# Patient Record
Sex: Male | Born: 1955 | Race: White | Hispanic: No | Marital: Married | Smoking: Former smoker
Health system: Southern US, Community
[De-identification: ages and names within clinical notes are randomized; demographics above are authoritative.]

## PROBLEM LIST (undated history)

## (undated) DIAGNOSIS — M1990 Unspecified osteoarthritis, unspecified site: Secondary | ICD-10-CM

## (undated) DIAGNOSIS — F988 Other specified behavioral and emotional disorders with onset usually occurring in childhood and adolescence: Secondary | ICD-10-CM

## (undated) DIAGNOSIS — E785 Hyperlipidemia, unspecified: Secondary | ICD-10-CM

## (undated) HISTORY — PX: OTHER SURGICAL HISTORY: SHX169

## (undated) HISTORY — DX: Hyperlipidemia, unspecified: E78.5

## (undated) HISTORY — DX: Unspecified osteoarthritis, unspecified site: M19.90

---

## 1999-06-05 ENCOUNTER — Encounter: Admission: RE | Admit: 1999-06-05 | Discharge: 1999-06-05 | Payer: Self-pay | Admitting: Specialist

## 1999-06-05 ENCOUNTER — Encounter: Payer: Self-pay | Admitting: Specialist

## 2002-05-06 HISTORY — PX: KNEE SURGERY: SHX244

## 2015-11-25 ENCOUNTER — Ambulatory Visit (HOSPITAL_COMMUNITY)
Admission: RE | Admit: 2015-11-25 | Discharge: 2015-11-25 | Disposition: A | Discharge status: Home / Self Care | Payer: Managed Care, Other (non HMO) | Source: Ambulatory Visit | Attending: Specialist | Admitting: Specialist

## 2015-11-25 ENCOUNTER — Other Ambulatory Visit (HOSPITAL_COMMUNITY): Payer: Self-pay | Admitting: Specialist

## 2015-11-25 DIAGNOSIS — R590 Localized enlarged lymph nodes: Secondary | ICD-10-CM | POA: Diagnosis not present

## 2015-11-25 DIAGNOSIS — R609 Edema, unspecified: Secondary | ICD-10-CM

## 2015-11-25 DIAGNOSIS — M7989 Other specified soft tissue disorders: Secondary | ICD-10-CM | POA: Diagnosis not present

## 2015-11-25 NOTE — Progress Notes (Signed)
VASCULAR LAB PRELIMINARY  PRELIMINARY  PRELIMINARY  PRELIMINARY  Right lower extremity venous duplex completed.    Preliminary report:  There is no DVT or SVT noted in the right lower extremity.  Enlarged inguinal lymph nodes noted bilaterally.  Tanaka Gillen, RVT 11/25/2015, 10:36 AM

## 2015-12-12 ENCOUNTER — Encounter: Payer: Self-pay | Admitting: Internal Medicine

## 2015-12-12 ENCOUNTER — Ambulatory Visit (INDEPENDENT_AMBULATORY_CARE_PROVIDER_SITE_OTHER): Payer: Managed Care, Other (non HMO) | Admitting: Internal Medicine

## 2015-12-12 ENCOUNTER — Encounter (INDEPENDENT_AMBULATORY_CARE_PROVIDER_SITE_OTHER): Payer: Self-pay

## 2015-12-12 VITALS — BP 128/80 | HR 66 | Temp 98.0°F | Resp 16 | Ht 73.0 in | Wt 212.0 lb

## 2015-12-12 DIAGNOSIS — E785 Hyperlipidemia, unspecified: Secondary | ICD-10-CM

## 2015-12-12 DIAGNOSIS — Z0000 Encounter for general adult medical examination without abnormal findings: Secondary | ICD-10-CM

## 2015-12-12 DIAGNOSIS — I10 Essential (primary) hypertension: Secondary | ICD-10-CM

## 2015-12-12 DIAGNOSIS — F988 Other specified behavioral and emotional disorders with onset usually occurring in childhood and adolescence: Secondary | ICD-10-CM | POA: Insufficient documentation

## 2015-12-12 DIAGNOSIS — M1711 Unilateral primary osteoarthritis, right knee: Secondary | ICD-10-CM

## 2015-12-12 DIAGNOSIS — Z136 Encounter for screening for cardiovascular disorders: Secondary | ICD-10-CM | POA: Diagnosis not present

## 2015-12-12 NOTE — Progress Notes (Signed)
NEW Patient Establishment and Complete Physical Examination   This very nice 60 y.o.male presents for complete physical.  Patient has no major health issues.  Patient reports no complaints at this time.   He reports that he is married for 35 years and does enjoy being outside.  He has grown children.  He reports that he is getting ready to retire soon.  He was a Pharmacist, hospitalcollege athlete and played baseball.  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    He has previously been under the care of Dr. Andrey CampanileWilson.  He reports that he wants to make sure that he is staying on top of his health.    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  He has always had elevated bilirubin.    He reports that he has not felt like himself.    Patient does have a history of ADD and is currently being seen by Dr. Evelene CroonKaur.   He is also being seen by orthopedics for osteoarhtritis of his knee.  He reports that he is due to have his knee replaced.  Dr. Thomasena Edisollins is his surgeon.    Finally, patient has history of Vitamin D Deficiency and last vitamin D was No results found for: VD25OH.  Currently on supplementation     No current outpatient prescriptions on file prior to visit.   No current facility-administered medications on file prior to visit.     Allergies not on file  No past medical history on file.   There is no immunization history on file for this patient.  No past surgical history on file.  No family history on file.  Social History   Social History  . Marital status: Married    Spouse name: N/A  . Number of children: N/A  . Years of education: N/A   Occupational History  . Not on file.   Social History Main Topics  . Smoking status: Not on file  . Smokeless tobacco: Not on file  . Alcohol use Not on file  . Drug use: Unknown  . Sexual activity: Not on file   Other Topics Concern  . Not on file   Social History Narrative  . No narrative on file    Review of Systems  Constitutional: Negative for chills, diaphoresis, fever and malaise/fatigue.  HENT: Negative for congestion, ear pain and sore throat.   Eyes: Negative.   Respiratory: Negative for cough, sputum production, shortness of breath and wheezing.   Cardiovascular: Positive for leg swelling. Negative for chest pain, palpitations, orthopnea and PND.  Gastrointestinal: Negative for abdominal pain, blood in stool, constipation, diarrhea, heartburn, melena, nausea and vomiting.  Genitourinary: Negative for dysuria, frequency, hematuria and urgency.  Musculoskeletal: Positive for joint pain.  Neurological: Positive for dizziness. Negative for sensory change, loss of consciousness and headaches.  Psychiatric/Behavioral: Positive for depression. The patient has insomnia. The patient is not nervous/anxious.       Physical Exam  Resp 18   Ht 6\' 1"  (1.854 m)   Wt 212 lb (96.2 kg)   BMI 27.97 kg/m   General Appearance: Well nourished and in no apparent distress. Eyes: PERRLA, EOMs, conjunctiva no swelling or erythema, normal fundi and vessels. Sinuses: No frontal/maxillary tenderness ENT/Mouth: EACs patent / TMs  nl. Nares clear without erythema, swelling, mucoid exudates. Oral hygiene is good. No erythema, swelling, or exudate. Tongue normal, non-obstructing. Tonsils not swollen or erythematous. Hearing normal.  Neck: Supple, thyroid normal. No  bruits, nodes or JVD. Respiratory: Respiratory effort normal.  BS equal and clear bilateral without rales, rhonci, wheezing or stridor. Cardio: Heart sounds are normal with regular rate and rhythm and no murmurs, rubs or gallops. Peripheral pulses are normal and equal bilaterally without edema. No aortic or femoral bruits. Chest: symmetric with normal excursions and percussion. Abdomen: Flat, soft, with bowl sounds. Nontender, no guarding, rebound, hernias, masses, or organomegaly.  Lymphatics: Non tender without lymphadenopathy.   Musculoskeletal: Full ROM all peripheral extremities, joint stability, 5/5 strength, and normal gait. Skin: Warm and dry without rashes, lesions, cyanosis, clubbing or  ecchymosis.  Neuro: Cranial nerves intact, reflexes equal bilaterally. Normal muscle tone, no cerebellar symptoms. Sensation intact.  Pysch: Awake and oriented X 3, normal affect, Insight and Judgment appropriate.   Assessment and Plan   1. Routine general medical examination at a health care facility -Labs were done at previous office -labs reviewed with patient and only abnormality was Hyperlipidemia -patient is cleared for surgery  2. ADD (attention deficit disorder)  - EKG 12-Lead  3. Hyperlipidemia LDL goal <130   4. Gilbert's syndrome       Continue prudent diet as discussed, weight control, regular exercise, and medications. Routine screening labs and tests as requested with regular follow-up as recommended.  Over 40 minutes of exam, counseling, chart review and critical decision making was performed

## 2015-12-13 ENCOUNTER — Encounter: Payer: Self-pay | Admitting: Internal Medicine

## 2015-12-13 DIAGNOSIS — M1710 Unilateral primary osteoarthritis, unspecified knee: Secondary | ICD-10-CM | POA: Insufficient documentation

## 2015-12-13 DIAGNOSIS — M179 Osteoarthritis of knee, unspecified: Secondary | ICD-10-CM | POA: Insufficient documentation

## 2015-12-13 DIAGNOSIS — E785 Hyperlipidemia, unspecified: Secondary | ICD-10-CM

## 2015-12-13 HISTORY — DX: Hyperlipidemia, unspecified: E78.5

## 2015-12-18 ENCOUNTER — Other Ambulatory Visit: Payer: Self-pay | Admitting: Orthopedic Surgery

## 2015-12-18 NOTE — H&P (Signed)
TOTAL KNEE ADMISSION H&P  Patient is being admitted for right total knee arthroplasty.  Subjective:  Chief Complaint:right knee pain.  HPI: Dean Cannon, 60 y.o. male, has a history of pain and functional disability in the right knee due to arthritis and has failed non-surgical conservative treatments for greater than 12 weeks to includeNSAID's and/or analgesics, corticosteriod injections, flexibility and strengthening excercises and activity modification.  Onset of symptoms was gradual, starting 2 years ago with gradually worsening course since that time. The patient noted prior procedures on the knee to include  arthroscopy on the right knee(s).  Patient currently rates pain in the right knee(s) at 7 out of 10 with activity. Patient has night pain, worsening of pain with activity and weight bearing, pain that interferes with activities of daily living, pain with passive range of motion and joint swelling.  Patient has evidence of subchondral sclerosis, periarticular osteophytes and joint space narrowing by imaging studies. There is no active infection.  Patient Active Problem List   Diagnosis Date Noted  . Hyperlipidemia LDL goal <130 12/13/2015  . OA (osteoarthritis) of knee 12/13/2015  . ADD (attention deficit disorder) 12/12/2015   Past Medical History:  Diagnosis Date  . Arthritis   . Asthma   . Depression   . Hyperlipidemia LDL goal <130 12/13/2015    Past Surgical History:  Procedure Laterality Date  . KNEE SURGERY Right 2004    No prescriptions prior to admission.   No Known Allergies  Social History  Substance Use Topics  . Smoking status: Current Every Day Smoker    Types: Cigars  . Smokeless tobacco: Former NeurosurgeonUser    Types: Chew  . Alcohol use 7.2 oz/week    12 Cans of beer per week    Family History  Problem Relation Age of Onset  . Cancer Mother     Lung Cancer  . Cancer Father     Blood dyscrasia  . Heart disease Father 1070    Heart attack   . Diabetes  Brother   . Cancer Maternal Grandmother     GI cancer  . Cancer Maternal Grandfather   . Cancer Paternal Grandmother   . Cancer Paternal Grandfather      Review of Systems  Constitutional: Negative.   HENT: Negative.   Eyes: Negative.   Respiratory: Negative.   Cardiovascular: Negative.   Gastrointestinal: Negative.   Genitourinary: Negative.   Musculoskeletal: Negative.   Skin: Negative.   Neurological: Negative.   Endo/Heme/Allergies: Negative.   Psychiatric/Behavioral: Negative.     Objective:  Physical Exam  Constitutional: He is oriented to person, place, and time. He appears well-developed.  HENT:  Head: Normocephalic.  Eyes: EOM are normal.  Neck: Normal range of motion.  Cardiovascular: Normal rate, normal heart sounds and intact distal pulses.   Respiratory: Effort normal and breath sounds normal.  GI: Soft.  Genitourinary:  Genitourinary Comments: Deferred  Musculoskeletal:  RLE grossly n/v intact. 2+ pedal pulse. Right knee stable at varus and valgus stress.   Neurological: He is alert and oriented to person, place, and time. He has normal reflexes.  Skin: Skin is warm and dry.  Psychiatric: His behavior is normal.    Vital signs in last 24 hours:    Labs:   Estimated body mass index is 27.97 kg/m as calculated from the following:   Height as of 12/12/15: 6\' 1"  (1.854 m).   Weight as of 12/12/15: 96.2 kg (212 lb).   Imaging Review Plain radiographs demonstrate  severe degenerative joint disease of the right knee(s). The overall alignment ismild varus. The bone quality appears to be good for age and reported activity level.  Assessment/Plan:  End stage arthritis, right knee   The patient history, physical examination, clinical judgment of the provider and imaging studies are consistent with end stage degenerative joint disease of the right knee(s) and total knee arthroplasty is deemed medically necessary. The treatment options including medical  management, injection therapy arthroscopy and arthroplasty were discussed at length. The risks and benefits of total knee arthroplasty were presented and reviewed. The risks due to aseptic loosening, infection, stiffness, patella tracking problems, thromboembolic complications and other imponderables were discussed. The patient acknowledged the explanation, agreed to proceed with the plan and consent was signed. Patient is being admitted for inpatient treatment for surgery, pain control, PT, OT, prophylactic antibiotics, VTE prophylaxis, progressive ambulation and ADL's and discharge planning. The patient is planning to be discharged home with home health services.  Will use IV tranexamic acid. Contraindications and adverse affects of Tranexamic acid discussed in detail. Patient denies any of these at this time and understands the risks and benefits.

## 2015-12-22 ENCOUNTER — Encounter (HOSPITAL_COMMUNITY)
Admission: RE | Admit: 2015-12-22 | Discharge: 2015-12-22 | Disposition: A | Discharge status: Home / Self Care | Payer: Managed Care, Other (non HMO) | Source: Ambulatory Visit | Attending: Specialist | Admitting: Specialist

## 2015-12-22 ENCOUNTER — Encounter (HOSPITAL_COMMUNITY): Payer: Self-pay

## 2015-12-22 DIAGNOSIS — Z01812 Encounter for preprocedural laboratory examination: Secondary | ICD-10-CM | POA: Diagnosis not present

## 2015-12-22 HISTORY — DX: Other specified behavioral and emotional disorders with onset usually occurring in childhood and adolescence: F98.8

## 2015-12-22 LAB — BASIC METABOLIC PANEL
Anion gap: 6 (ref 5–15)
BUN: 21 mg/dL — ABNORMAL HIGH (ref 6–20)
CHLORIDE: 106 mmol/L (ref 101–111)
CO2: 27 mmol/L (ref 22–32)
CREATININE: 1.09 mg/dL (ref 0.61–1.24)
Calcium: 9.2 mg/dL (ref 8.9–10.3)
Glucose, Bld: 113 mg/dL — ABNORMAL HIGH (ref 65–99)
POTASSIUM: 4.9 mmol/L (ref 3.5–5.1)
SODIUM: 139 mmol/L (ref 135–145)

## 2015-12-22 LAB — CBC
HEMATOCRIT: 41.6 % (ref 39.0–52.0)
Hemoglobin: 14.1 g/dL (ref 13.0–17.0)
MCH: 31.5 pg (ref 26.0–34.0)
MCHC: 33.9 g/dL (ref 30.0–36.0)
MCV: 92.9 fL (ref 78.0–100.0)
PLATELETS: 241 10*3/uL (ref 150–400)
RBC: 4.48 MIL/uL (ref 4.22–5.81)
RDW: 12.6 % (ref 11.5–15.5)
WBC: 5.6 10*3/uL (ref 4.0–10.5)

## 2015-12-22 LAB — URINALYSIS, ROUTINE W REFLEX MICROSCOPIC
Bilirubin Urine: NEGATIVE
GLUCOSE, UA: NEGATIVE mg/dL
HGB URINE DIPSTICK: NEGATIVE
KETONES UR: NEGATIVE mg/dL
Leukocytes, UA: NEGATIVE
Nitrite: NEGATIVE
PROTEIN: NEGATIVE mg/dL
Specific Gravity, Urine: 1.022 (ref 1.005–1.030)
pH: 5.5 (ref 5.0–8.0)

## 2015-12-22 LAB — PROTIME-INR
INR: 0.86
Prothrombin Time: 11.7 seconds (ref 11.4–15.2)

## 2015-12-22 LAB — SURGICAL PCR SCREEN
MRSA, PCR: NEGATIVE
Staphylococcus aureus: NEGATIVE

## 2015-12-22 LAB — APTT: APTT: 30 s (ref 24–36)

## 2015-12-22 LAB — ABO/RH: ABO/RH(D): A POS

## 2015-12-22 NOTE — Patient Instructions (Signed)
Dean BenceJohn M Cannon  12/22/2015   Your procedure is scheduled on: Tuesday January 02, 2016  [facility]  Entrance [place]  elevators to 3rd floor to  Short Stay Center at 5:30 AM.  Call this number if you have problems the morning of surgery [phone]   Remember: ONLY 1 PERSON MAY GO WITH YOU TO SHORT STAY TO GET  READY MORNING OF YOUR SURGERY.  Do not eat food or drink liquids :After Midnight.     Take these medicines the morning of surgery with A SIP OF WATER: Escitalopram (Lexapro)                               You may not have any metal on your body including hair pins and              piercings  Do not wear jewelry,  lotions, powders or colognes, deodorant             Men may shave face and neck.   Do not bring valuables to the hospital. [name] IS NOT             RESPONSIBLE   FOR VALUABLES.  Contacts, dentures or bridgework may not be worn into surgery.  Leave suitcase in the car. After surgery it may be brought to your room.    Special Instructions: NO SMOKING 24 HOURS PRIOR TO SURGICAL PROCEDURE DATE               Please read over the following fact sheets you were given:MRSA INFORMATION SHEET; INCENTIVE SPIROMETER _____________________________________________________________________             [name] Before surgery, you can play an important role.  Because skin is not sterile, your skin needs to be as free of germs as possible.  You can reduce the number of germs on your skin by washing with CHG (chlorahexidine gluconate) soap before surgery.  CHG is an antiseptic cleaner which kills germs and bonds with the skin to continue killing germs even after washing. Please DO NOT use if you have an allergy to CHG or antibacterial soaps.  If your skin becomes reddened/irritated stop using the CHG and inform your nurse when you arrive at Short Stay. Do not shave (including legs and underarms) for at least 48  hours prior to the first CHG shower.  You may shave your face/neck. Please follow these instructions carefully:  1.  Shower with CHG Soap the night before surgery and the  morning of Surgery.  2.  If you choose to wash your hair, wash your hair first as usual with your  normal  shampoo.  3.  After you shampoo, rinse your hair and body thoroughly to remove the  shampoo.                           4.  Use CHG as you would any other liquid soap.  You can apply chg directly  to the skin and wash                       Gently with a scrungie or clean washcloth.  5.  Apply the CHG Soap to your body ONLY FROM THE NECK DOWN.  Do not use on face/ open                           Wound or open sores. Avoid contact with eyes, ears mouth and genitals (private parts).                       Wash face,  Genitals (private parts) with your normal soap.             6.  Wash thoroughly, paying special attention to the area where your surgery  will be performed.  7.  Thoroughly rinse your body with warm water from the neck down.  8.  DO NOT shower/wash with your normal soap after using and rinsing off  the CHG Soap.                9.  Pat yourself dry with a clean towel.            10.  Wear clean pajamas.            11.  Place clean sheets on your bed the night of your first shower and do not  sleep with pets. Day of Surgery : Do not apply any lotions/deodorants the morning of surgery.  Please wear clean clothes to the hospital/surgery center.  FAILURE TO FOLLOW THESE INSTRUCTIONS MAY RESULT IN THE CANCELLATION OF YOUR SURGERY PATIENT SIGNATURE_________________________________  NURSE SIGNATURE__________________________________  ________________________________________________________________________   Dean Cannon  An incentive spirometer is a tool that can help keep your lungs clear and active. This tool measures how well you are filling your lungs with each breath. Taking long deep breaths may help  reverse or decrease the chance of developing breathing (pulmonary) problems (especially infection) following:  A long period of time when you are unable to move or be active. BEFORE THE PROCEDURE   If the spirometer includes an indicator to show your best effort, your nurse or respiratory therapist will set it to a desired goal.  If possible, sit up straight or lean slightly forward. Try not to slouch.  Hold the incentive spirometer in an upright position. INSTRUCTIONS FOR USE  1. Sit on the edge of your bed if possible, or sit up as far as you can in bed or on a chair. 2. Hold the incentive spirometer in an upright position. 3. Breathe out normally. 4. Place the mouthpiece in your mouth and seal your lips tightly around it. 5. Breathe in slowly and as deeply as possible, raising the piston or the ball toward the top of the column. 6. Hold your breath for 3-5 seconds or for as long as possible. Allow the piston or ball to fall to the bottom of the column. 7. Remove the mouthpiece from your mouth and breathe out normally. 8. Rest for a few seconds and repeat Steps 1 through 7 at least 10 times every 1-2 hours when you are awake. Take your time and take a few normal breaths between deep breaths. 9. The spirometer may include an indicator to show your best effort. Use the indicator as a goal to work toward during each repetition. 10. After each set of 10 deep breaths, practice coughing to be sure your lungs are clear. If you have an incision (the cut made at the time of surgery), support your incision when coughing by placing a pillow or rolled up towels firmly against it. Once you are able to get out of  bed, walk around indoors and cough well. You may stop using the incentive spirometer when instructed by your caregiver.  RISKS AND COMPLICATIONS  Take your time so you do not get dizzy or light-headed.  If you are in pain, you may need to take or ask for pain medication before doing incentive  spirometry. It is harder to take a deep breath if you are having pain. AFTER USE  Rest and breathe slowly and easily.  It can be helpful to keep track of a log of your progress. Your caregiver can provide you with a simple table to help with this. If you are using the spirometer at home, follow these instructions: Malcolm IF:   You are having difficultly using the spirometer.  You have trouble using the spirometer as often as instructed.  Your pain medication is not giving enough relief while using the spirometer.  You develop fever of 100.5 F (38.1 C) or higher. SEEK IMMEDIATE MEDICAL CARE IF:   You cough up bloody sputum that had not been present before.  You develop fever of 102 F (38.9 C) or greater.  You develop worsening pain at or near the incision site. MAKE SURE YOU:   Understand these instructions.  Will watch your condition.  Will get help right away if you are not doing well or get worse. Document Released: 09/02/2006 Document Revised: 07/15/2011 Document Reviewed: 11/03/2006 [facility] Patient Information 2014 [place], [place].   ________________________________________________________________________

## 2015-12-22 NOTE — Progress Notes (Signed)
Clearance note per chart per Dr Benedetto GoadFred Wilson 06/22/2015

## 2016-01-02 ENCOUNTER — Inpatient Hospital Stay (HOSPITAL_COMMUNITY): Payer: Managed Care, Other (non HMO) | Admitting: Anesthesiology

## 2016-01-02 ENCOUNTER — Encounter (HOSPITAL_COMMUNITY)
Admission: RE | Disposition: A | Discharge status: Home Health | Payer: Self-pay | Source: Ambulatory Visit | Attending: Specialist

## 2016-01-02 ENCOUNTER — Inpatient Hospital Stay (HOSPITAL_COMMUNITY)
Admission: RE | Admit: 2016-01-02 | Discharge: 2016-01-04 | DRG: 470 | Disposition: A | Discharge status: Home Health | Payer: Managed Care, Other (non HMO) | Source: Ambulatory Visit | Attending: Specialist | Admitting: Specialist

## 2016-01-02 ENCOUNTER — Encounter (HOSPITAL_COMMUNITY): Payer: Self-pay

## 2016-01-02 DIAGNOSIS — Z96659 Presence of unspecified artificial knee joint: Secondary | ICD-10-CM

## 2016-01-02 DIAGNOSIS — M1711 Unilateral primary osteoarthritis, right knee: Principal | ICD-10-CM | POA: Diagnosis present

## 2016-01-02 DIAGNOSIS — F17290 Nicotine dependence, other tobacco product, uncomplicated: Secondary | ICD-10-CM | POA: Diagnosis present

## 2016-01-02 DIAGNOSIS — J45909 Unspecified asthma, uncomplicated: Secondary | ICD-10-CM | POA: Diagnosis present

## 2016-01-02 DIAGNOSIS — E875 Hyperkalemia: Secondary | ICD-10-CM | POA: Diagnosis not present

## 2016-01-02 DIAGNOSIS — F988 Other specified behavioral and emotional disorders with onset usually occurring in childhood and adolescence: Secondary | ICD-10-CM | POA: Diagnosis present

## 2016-01-02 DIAGNOSIS — E785 Hyperlipidemia, unspecified: Secondary | ICD-10-CM | POA: Diagnosis present

## 2016-01-02 DIAGNOSIS — F329 Major depressive disorder, single episode, unspecified: Secondary | ICD-10-CM | POA: Diagnosis present

## 2016-01-02 DIAGNOSIS — M25561 Pain in right knee: Secondary | ICD-10-CM | POA: Diagnosis present

## 2016-01-02 HISTORY — PX: TOTAL KNEE ARTHROPLASTY: SHX125

## 2016-01-02 LAB — TYPE AND SCREEN
ABO/RH(D): A POS
ANTIBODY SCREEN: NEGATIVE

## 2016-01-02 SURGERY — ARTHROPLASTY, KNEE, TOTAL
Anesthesia: Spinal | Site: Knee | Laterality: Right

## 2016-01-02 MED ORDER — SODIUM CHLORIDE 0.9 % IV SOLN
1000.0000 mg | INTRAVENOUS | Status: AC
  Administered 2016-01-02: 1000 mg via INTRAVENOUS
  Filled 2016-01-02: qty 10

## 2016-01-02 MED ORDER — ONDANSETRON HCL 4 MG PO TABS: 4.0000 mg | ORAL_TABLET | Freq: Four times a day (QID) | ORAL | Status: DC | PRN

## 2016-01-02 MED ORDER — ALUM & MAG HYDROXIDE-SIMETH 200-200-20 MG/5ML PO SUSP: 30.0000 mL | ORAL | Status: DC | PRN

## 2016-01-02 MED ORDER — KETOROLAC TROMETHAMINE 30 MG/ML IJ SOLN
INTRAMUSCULAR | Status: AC
  Filled 2016-01-02: qty 1

## 2016-01-02 MED ORDER — DIPHENHYDRAMINE HCL 12.5 MG/5ML PO ELIX: 12.5000 mg | ORAL_SOLUTION | ORAL | Status: DC | PRN

## 2016-01-02 MED ORDER — ACETAMINOPHEN 650 MG RE SUPP: 650.0000 mg | Freq: Four times a day (QID) | RECTAL | Status: DC | PRN

## 2016-01-02 MED ORDER — CEFAZOLIN SODIUM-DEXTROSE 2-4 GM/100ML-% IV SOLN
INTRAVENOUS | Status: AC
  Filled 2016-01-02: qty 100

## 2016-01-02 MED ORDER — METHOCARBAMOL 500 MG PO TABS
500.0000 mg | ORAL_TABLET | Freq: Four times a day (QID) | ORAL | Status: DC | PRN
  Administered 2016-01-02 – 2016-01-04 (×6): 500 mg via ORAL
  Filled 2016-01-02 (×6): qty 1

## 2016-01-02 MED ORDER — DEXAMETHASONE SODIUM PHOSPHATE 10 MG/ML IJ SOLN
10.0000 mg | Freq: Once | INTRAMUSCULAR | Status: AC
  Administered 2016-01-02: 10 mg via INTRAVENOUS

## 2016-01-02 MED ORDER — METHOCARBAMOL 1000 MG/10ML IJ SOLN
500.0000 mg | Freq: Four times a day (QID) | INTRAVENOUS | Status: DC | PRN
  Administered 2016-01-02: 500 mg via INTRAVENOUS
  Filled 2016-01-02: qty 550
  Filled 2016-01-02: qty 5

## 2016-01-02 MED ORDER — DOCUSATE SODIUM 100 MG PO CAPS
100.0000 mg | ORAL_CAPSULE | Freq: Two times a day (BID) | ORAL | Status: DC
  Administered 2016-01-02 – 2016-01-04 (×3): 100 mg via ORAL
  Filled 2016-01-02 (×4): qty 1

## 2016-01-02 MED ORDER — SODIUM CHLORIDE 0.9 % IR SOLN
Status: DC | PRN
  Administered 2016-01-02: 1000 mL

## 2016-01-02 MED ORDER — ENOXAPARIN SODIUM 30 MG/0.3ML ~~LOC~~ SOLN
30.0000 mg | Freq: Two times a day (BID) | SUBCUTANEOUS | Status: DC
Start: 2016-01-03 — End: 2016-01-04
  Administered 2016-01-03 – 2016-01-04 (×2): 30 mg via SUBCUTANEOUS
  Filled 2016-01-02 (×3): qty 0.3

## 2016-01-02 MED ORDER — DEXAMETHASONE SODIUM PHOSPHATE 10 MG/ML IJ SOLN
INTRAMUSCULAR | Status: AC
  Filled 2016-01-02: qty 1

## 2016-01-02 MED ORDER — AMPHETAMINE-DEXTROAMPHETAMINE 10 MG PO TABS
20.0000 mg | ORAL_TABLET | Freq: Every day | ORAL | Status: DC
  Administered 2016-01-02 – 2016-01-04 (×3): 20 mg via ORAL
  Filled 2016-01-02 (×3): qty 2

## 2016-01-02 MED ORDER — METOCLOPRAMIDE HCL 5 MG/ML IJ SOLN: 5.0000 mg | Freq: Three times a day (TID) | INTRAMUSCULAR | Status: DC | PRN

## 2016-01-02 MED ORDER — MIDAZOLAM HCL 5 MG/5ML IJ SOLN
INTRAMUSCULAR | Status: DC | PRN
  Administered 2016-01-02: 2 mg via INTRAVENOUS

## 2016-01-02 MED ORDER — METOCLOPRAMIDE HCL 5 MG PO TABS: 5.0000 mg | ORAL_TABLET | Freq: Three times a day (TID) | ORAL | Status: DC | PRN

## 2016-01-02 MED ORDER — DEXAMETHASONE SODIUM PHOSPHATE 10 MG/ML IJ SOLN
10.0000 mg | Freq: Once | INTRAMUSCULAR | Status: AC
  Administered 2016-01-03: 10 mg via INTRAVENOUS
  Filled 2016-01-02: qty 1

## 2016-01-02 MED ORDER — SODIUM CHLORIDE 0.9 % IJ SOLN
INTRAMUSCULAR | Status: DC | PRN
  Administered 2016-01-02: 29 mL

## 2016-01-02 MED ORDER — PROPOFOL 500 MG/50ML IV EMUL
INTRAVENOUS | Status: DC | PRN
  Administered 2016-01-02: 75 ug/kg/min via INTRAVENOUS

## 2016-01-02 MED ORDER — HYDROMORPHONE HCL 1 MG/ML IJ SOLN: 0.2500 mg | INTRAMUSCULAR | Status: DC | PRN

## 2016-01-02 MED ORDER — ACETAMINOPHEN 325 MG PO TABS
650.0000 mg | ORAL_TABLET | Freq: Four times a day (QID) | ORAL | Status: DC | PRN
  Administered 2016-01-03: 650 mg via ORAL
  Filled 2016-01-02: qty 2

## 2016-01-02 MED ORDER — POTASSIUM CHLORIDE IN NACL 20-0.9 MEQ/L-% IV SOLN
INTRAVENOUS | Status: DC
  Administered 2016-01-02: 14:00:00 via INTRAVENOUS
  Filled 2016-01-02 (×2): qty 1000

## 2016-01-02 MED ORDER — PROMETHAZINE HCL 25 MG/ML IJ SOLN: 6.2500 mg | INTRAMUSCULAR | Status: DC | PRN

## 2016-01-02 MED ORDER — BUPIVACAINE IN DEXTROSE 0.75-8.25 % IT SOLN
INTRATHECAL | Status: DC | PRN
  Administered 2016-01-02: 2 mL via INTRATHECAL

## 2016-01-02 MED ORDER — CEFAZOLIN SODIUM-DEXTROSE 2-4 GM/100ML-% IV SOLN
2.0000 g | Freq: Four times a day (QID) | INTRAVENOUS | Status: AC
  Administered 2016-01-02 (×2): 2 g via INTRAVENOUS
  Filled 2016-01-02 (×2): qty 100

## 2016-01-02 MED ORDER — BUPIVACAINE-EPINEPHRINE 0.25% -1:200000 IJ SOLN
INTRAMUSCULAR | Status: DC | PRN
  Administered 2016-01-02: 30 mL

## 2016-01-02 MED ORDER — MIDAZOLAM HCL 2 MG/2ML IJ SOLN
INTRAMUSCULAR | Status: AC
  Filled 2016-01-02: qty 2

## 2016-01-02 MED ORDER — 0.9 % SODIUM CHLORIDE (POUR BTL) OPTIME
TOPICAL | Status: DC | PRN
  Administered 2016-01-02: 1000 mL

## 2016-01-02 MED ORDER — PROPOFOL 10 MG/ML IV BOLUS
INTRAVENOUS | Status: DC | PRN
  Administered 2016-01-02 (×3): 20 mg via INTRAVENOUS

## 2016-01-02 MED ORDER — PHENOL 1.4 % MT LIQD
1.0000 | OROMUCOSAL | Status: DC | PRN
  Filled 2016-01-02: qty 177

## 2016-01-02 MED ORDER — TEMAZEPAM 15 MG PO CAPS
15.0000 mg | ORAL_CAPSULE | Freq: Every evening | ORAL | Status: DC | PRN
  Administered 2016-01-03 (×2): 15 mg via ORAL
  Filled 2016-01-02 (×2): qty 1

## 2016-01-02 MED ORDER — ONDANSETRON HCL 4 MG/2ML IJ SOLN
INTRAMUSCULAR | Status: DC | PRN
  Administered 2016-01-02: 4 mg via INTRAVENOUS

## 2016-01-02 MED ORDER — BISACODYL 5 MG PO TBEC: 5.0000 mg | DELAYED_RELEASE_TABLET | Freq: Every day | ORAL | Status: DC | PRN

## 2016-01-02 MED ORDER — ASPIRIN EC 325 MG PO TBEC: 325.0000 mg | DELAYED_RELEASE_TABLET | Freq: Two times a day (BID) | ORAL | 0 refills | Status: DC

## 2016-01-02 MED ORDER — POLYETHYLENE GLYCOL 3350 17 G PO PACK: 17.0000 g | PACK | Freq: Every day | ORAL | Status: DC | PRN

## 2016-01-02 MED ORDER — PROPOFOL 10 MG/ML IV BOLUS
INTRAVENOUS | Status: AC
  Filled 2016-01-02: qty 20

## 2016-01-02 MED ORDER — ESCITALOPRAM OXALATE 10 MG PO TABS
10.0000 mg | ORAL_TABLET | Freq: Every day | ORAL | Status: DC
  Administered 2016-01-03 – 2016-01-04 (×2): 10 mg via ORAL
  Filled 2016-01-02 (×2): qty 1

## 2016-01-02 MED ORDER — METHOCARBAMOL 500 MG PO TABS: 500.0000 mg | ORAL_TABLET | Freq: Three times a day (TID) | ORAL | 2 refills | Status: DC | PRN

## 2016-01-02 MED ORDER — PROPOFOL 10 MG/ML IV BOLUS
INTRAVENOUS | Status: AC
  Filled 2016-01-02: qty 60

## 2016-01-02 MED ORDER — FERROUS SULFATE 325 (65 FE) MG PO TABS
325.0000 mg | ORAL_TABLET | Freq: Three times a day (TID) | ORAL | Status: DC
Start: 2016-01-02 — End: 2016-01-04
  Administered 2016-01-02 – 2016-01-04 (×5): 325 mg via ORAL
  Filled 2016-01-02 (×5): qty 1

## 2016-01-02 MED ORDER — GLUCOSAMINE-CHONDROITIN 500-400 MG PO TABS: 1.0000 | ORAL_TABLET | Freq: Three times a day (TID) | ORAL | Status: DC

## 2016-01-02 MED ORDER — MENTHOL 3 MG MT LOZG: 1.0000 | LOZENGE | OROMUCOSAL | Status: DC | PRN

## 2016-01-02 MED ORDER — ONDANSETRON HCL 4 MG/2ML IJ SOLN: 4.0000 mg | Freq: Four times a day (QID) | INTRAMUSCULAR | Status: DC | PRN

## 2016-01-02 MED ORDER — FENTANYL CITRATE (PF) 100 MCG/2ML IJ SOLN
INTRAMUSCULAR | Status: DC | PRN
  Administered 2016-01-02: 100 ug via INTRAVENOUS

## 2016-01-02 MED ORDER — ONDANSETRON HCL 4 MG/2ML IJ SOLN
INTRAMUSCULAR | Status: AC
  Filled 2016-01-02: qty 2

## 2016-01-02 MED ORDER — SODIUM CHLORIDE 0.9 % IJ SOLN
INTRAMUSCULAR | Status: AC
  Filled 2016-01-02: qty 50

## 2016-01-02 MED ORDER — OXYCODONE-ACETAMINOPHEN 5-325 MG PO TABS: 1.0000 | ORAL_TABLET | ORAL | 0 refills | Status: DC | PRN

## 2016-01-02 MED ORDER — BIOTIN 10 MG PO CAPS: 10.0000 mg | ORAL_CAPSULE | Freq: Every day | ORAL | Status: DC

## 2016-01-02 MED ORDER — CEFAZOLIN SODIUM-DEXTROSE 2-4 GM/100ML-% IV SOLN
2.0000 g | INTRAVENOUS | Status: AC
  Administered 2016-01-02: 2 g via INTRAVENOUS

## 2016-01-02 MED ORDER — FENTANYL CITRATE (PF) 100 MCG/2ML IJ SOLN
INTRAMUSCULAR | Status: AC
  Filled 2016-01-02: qty 2

## 2016-01-02 MED ORDER — MAGNESIUM CITRATE PO SOLN: 1.0000 | Freq: Once | ORAL | Status: DC | PRN

## 2016-01-02 MED ORDER — KETOROLAC TROMETHAMINE 30 MG/ML IJ SOLN
INTRAMUSCULAR | Status: DC | PRN
Start: 2016-01-02 — End: 2016-01-02
  Administered 2016-01-02: 30 mg

## 2016-01-02 MED ORDER — BUPIVACAINE-EPINEPHRINE (PF) 0.25% -1:200000 IJ SOLN
INTRAMUSCULAR | Status: AC
  Filled 2016-01-02: qty 30

## 2016-01-02 MED ORDER — STERILE WATER FOR IRRIGATION IR SOLN
Status: DC | PRN
  Administered 2016-01-02: 2000 mL

## 2016-01-02 MED ORDER — OXYCODONE HCL 5 MG PO TABS
5.0000 mg | ORAL_TABLET | ORAL | Status: DC | PRN
  Administered 2016-01-02: 10 mg via ORAL
  Administered 2016-01-02: 5 mg via ORAL
  Administered 2016-01-02 – 2016-01-03 (×5): 10 mg via ORAL
  Filled 2016-01-02: qty 1
  Filled 2016-01-02 (×7): qty 2

## 2016-01-02 MED ORDER — HYDROMORPHONE HCL 1 MG/ML IJ SOLN: 1.0000 mg | INTRAMUSCULAR | Status: DC | PRN

## 2016-01-02 MED ORDER — LACTATED RINGERS IV SOLN
INTRAVENOUS | Status: DC | PRN
  Administered 2016-01-02 (×2): via INTRAVENOUS

## 2016-01-02 SURGICAL SUPPLY — 59 items
BAG SPEC THK2 15X12 ZIP CLS (MISCELLANEOUS) ×2
BAG ZIPLOCK 12X15 (MISCELLANEOUS) ×6 IMPLANT
BANDAGE ACE 4X5 VEL STRL LF (GAUZE/BANDAGES/DRESSINGS) ×3 IMPLANT
BANDAGE ACE 6X5 VEL STRL LF (GAUZE/BANDAGES/DRESSINGS) ×3 IMPLANT
BLADE SAG 18X100X1.27 (BLADE) ×3 IMPLANT
BLADE SAW SGTL 13.0X1.19X90.0M (BLADE) ×3 IMPLANT
BLADE SURG SZ20 CARB STEEL (BLADE) ×2 IMPLANT
CAP KNEE TOTAL 3 SIGMA ×2 IMPLANT
CEMENT HV SMART SET (Cement) ×4 IMPLANT
CLOTH BEACON ORANGE TIMEOUT ST (SAFETY) ×3 IMPLANT
CUFF TOURN SGL QUICK 34 (TOURNIQUET CUFF) ×3
CUFF TRNQT CYL 34X4X40X1 (TOURNIQUET CUFF) ×1 IMPLANT
DECANTER SPIKE VIAL GLASS SM (MISCELLANEOUS) ×3 IMPLANT
DRAPE U-SHAPE 47X51 STRL (DRAPES) ×3 IMPLANT
DRESSING AQUACEL AG SP 3.5X10 (GAUZE/BANDAGES/DRESSINGS) IMPLANT
DRSG AQUACEL AG SP 3.5X10 (GAUZE/BANDAGES/DRESSINGS) ×3
DRSG TEGADERM 4X4.75 (GAUZE/BANDAGES/DRESSINGS) ×3 IMPLANT
DURAPREP 26ML APPLICATOR (WOUND CARE) ×6 IMPLANT
ELECT REM PT RETURN 9FT ADLT (ELECTROSURGICAL) ×3
ELECTRODE REM PT RTRN 9FT ADLT (ELECTROSURGICAL) ×1 IMPLANT
EVACUATOR 1/8 PVC DRAIN (DRAIN) ×3 IMPLANT
GAUZE SPONGE 2X2 8PLY STRL LF (GAUZE/BANDAGES/DRESSINGS) ×1 IMPLANT
GLOVE BIOGEL PI IND STRL 7.5 (GLOVE) IMPLANT
GLOVE BIOGEL PI IND STRL 8 (GLOVE) ×2 IMPLANT
GLOVE BIOGEL PI INDICATOR 7.5 (GLOVE) ×8
GLOVE BIOGEL PI INDICATOR 8 (GLOVE) ×6
GLOVE ECLIPSE 8.0 STRL XLNG CF (GLOVE) ×6 IMPLANT
GLOVE SURG ORTHO 9.0 STRL STRW (GLOVE) ×3 IMPLANT
GLOVE SURG SS PI 7.5 STRL IVOR (GLOVE) ×7 IMPLANT
GOWN STRL REUS W/ TWL XL LVL3 (GOWN DISPOSABLE) IMPLANT
GOWN STRL REUS W/TWL XL LVL3 (GOWN DISPOSABLE) ×11 IMPLANT
HANDPIECE INTERPULSE COAX TIP (DISPOSABLE) ×3
IMMOBILIZER KNEE 20 (SOFTGOODS) ×3
IMMOBILIZER KNEE 20 THIGH 36 (SOFTGOODS) ×1 IMPLANT
LIQUID BAND (GAUZE/BANDAGES/DRESSINGS) ×3 IMPLANT
PACK TOTAL KNEE CUSTOM (KITS) ×3 IMPLANT
POSITIONER SURGICAL ARM (MISCELLANEOUS) ×3 IMPLANT
SET HNDPC FAN SPRY TIP SCT (DISPOSABLE) ×1 IMPLANT
SET PAD KNEE POSITIONER (MISCELLANEOUS) ×3 IMPLANT
SPONGE GAUZE 2X2 STER 10/PKG (GAUZE/BANDAGES/DRESSINGS) ×2
SPONGE SURGIFOAM ABS GEL 100 (HEMOSTASIS) ×3 IMPLANT
STOCKINETTE 6  STRL (DRAPES) ×2
STOCKINETTE 6 STRL (DRAPES) ×1 IMPLANT
SUCTION FRAZIER HANDLE 12FR (TUBING) ×2
SUCTION TUBE FRAZIER 12FR DISP (TUBING) ×1 IMPLANT
SUT BONE WAX W31G (SUTURE) IMPLANT
SUT MNCRL AB 3-0 PS2 18 (SUTURE) ×3 IMPLANT
SUT MNCRL AB 4-0 PS2 18 (SUTURE) ×2 IMPLANT
SUT VIC AB 1 CT1 27 (SUTURE) ×12
SUT VIC AB 1 CT1 27XBRD ANTBC (SUTURE) ×4 IMPLANT
SUT VIC AB 2-0 CT1 27 (SUTURE) ×6
SUT VIC AB 2-0 CT1 TAPERPNT 27 (SUTURE) ×2 IMPLANT
SUT VLOC 180 0 24IN GS25 (SUTURE) ×3 IMPLANT
SYR 50ML LL SCALE MARK (SYRINGE) ×3 IMPLANT
TAPE STRIPS DRAPE STRL (GAUZE/BANDAGES/DRESSINGS) ×3 IMPLANT
TOWER CARTRIDGE SMART MIX (DISPOSABLE) ×3 IMPLANT
TRAY FOLEY W/METER SILVER 16FR (SET/KITS/TRAYS/PACK) ×3 IMPLANT
WRAP KNEE MAXI GEL POST OP (GAUZE/BANDAGES/DRESSINGS) ×3 IMPLANT
YANKAUER SUCT BULB TIP 10FT TU (MISCELLANEOUS) ×3 IMPLANT

## 2016-01-02 NOTE — Op Note (Signed)
DATE OF SURGERY:  01/02/2016  TIME: 9:57 AM  PATIENT NAME:  Dean BenceJohn M Naff    AGE: 60 y.o.   PRE-OPERATIVE DIAGNOSIS:  right knee osteoarthritis  POST-OPERATIVE DIAGNOSIS:  right knee osteoarthritis  PROCEDURE:  Procedure(s): RIGHT TOTAL KNEE ARTHROPLASTY  SURGEON:  Quinn Bartling ANDREW  ASSISTANT:  Bryson Stilwell, PA-C, present and scrubbed throughout the case, critical for assistance with exposure, retraction, instrumentation, and closure.  OPERATIVE IMPLANTS: Depuy PFC Sigma Rotating Platform.  Femur size 6, Tibia size 5, Patella size 41 3-peg oval button, with a 10 mm polyethylene insert.   PREOPERATIVE INDICATIONS:   Dean BenceJohn M Urquilla is a 60 y.o. year old male with end stage bone on bone arthritis of the knee who failed conservative treatment and elected for Total Knee Arthroplasty.   The risks, benefits, and alternatives were discussed at length including but not limited to the risks of infection, bleeding, nerve injury, stiffness, blood clots, the need for revision surgery, cardiopulmonary complications, among others, and they were willing to proceed.  OPERATIVE DESCRIPTION:  The patient was brought to the operative room and placed in a supine position.  Spinal anesthesia was administered.  IV antibiotics were given.  The lower extremity was prepped and draped in the usual sterile fashion.  Time out was performed.  The leg was elevated and exsanguinated and the tourniquet was inflated.  Anterior quadriceps tendon splitting approach was performed.  The patella was retracted and osteophytes were removed.  The anterior horn of the medial and lateral meniscus was removed and cruciate ligaments resected.   The distal femur was opened with the drill and the intramedullary distal femoral cutting jig was utilized, set at 5 degrees resecting 10 mm off the distal femur.  Care was taken to protect the collateral ligaments.  The distal femoral sizing jig was applied, taking care to avoid  notching.  Then the 4-in-1 cutting jig was applied and the anterior and posterior femur was cut, along with the chamfer cuts.    Then the extramedullary tibial cutting jig was utilized making the appropriate cut using the anterior tibial crest as a reference building in appropriate posterior slope.  Care was taken during the cut to protect the medial and collateral ligaments.  The proximal tibia was removed along with the posterior horns of the menisci.   The posterior medial femoral osteophytes and posterior lateral femoral osteophytes were removed.    The flexion gap was then measured and was symmetric with the extension gap, measured at 10.  I completed the distal femoral preparation using the appropriate jig to prepare the box.  The patella was then measured, and cut with the saw.    The proximal tibia sized and prepared accordingly with the reamer and the punch, and then all components were trialed with the trial insert.  The knee was found to have excellent balance and full motion.    The above named components were then cemented into place and all excess cement was removed.  The trial polyethylene component was in place during cementation, and then was exchanged for the real polyethylene component.    The knee was easily taken through a range of motion and the patella tracked well and the knee irrigated copiously and the parapatellar and subcutaneous tissue closed with vicryl, and monocryl with steri strips for the skin.  The arthrotomy was closed at 90 of flexion. The wounds were dressed with sterile gauze and the tourniquet released and the patient was awakened and returned to the PACU  in stable and satisfactory condition.  There were no complications.  Total tourniquet time was 90 minutes.

## 2016-01-02 NOTE — Transfer of Care (Signed)
Immediate Anesthesia Transfer of Care Note  Patient: Dean BenceJohn M Hinkley  Procedure(s) Performed: Procedure(s): RIGHT TOTAL KNEE ARTHROPLASTY (Right)  Patient Location: PACU  Anesthesia Type:MAC and Spinal  Level of Consciousness: awake, alert , oriented and patient cooperative  Airway & Oxygen Therapy: Patient Spontanous Breathing and Patient connected to face mask oxygen  Post-op Assessment: Report given to RN and Post -op Vital signs reviewed and stable  Post vital signs: Reviewed and stable  Last Vitals:  Vitals:   01/02/16 0523  BP: 137/82  Pulse: (!) 58  Resp: 16  Temp: 36.7 C    Last Pain:  Vitals:   01/02/16 0523  TempSrc: Oral         Complications: No apparent anesthesia complications

## 2016-01-02 NOTE — Anesthesia Preprocedure Evaluation (Signed)
Anesthesia Evaluation  Patient identified by MRN, date of birth, ID band Patient awake    Reviewed: Allergy & Precautions, NPO status , Patient's Chart, lab work & pertinent test results  Airway Mallampati: II  TM Distance: >3 FB Neck ROM: Full    Dental no notable dental hx.    Pulmonary former smoker,    Pulmonary exam normal breath sounds clear to auscultation       Cardiovascular negative cardio ROS Normal cardiovascular exam Rhythm:Regular Rate:Normal     Neuro/Psych negative neurological ROS  negative psych ROS   GI/Hepatic negative GI ROS, Neg liver ROS,   Endo/Other  negative endocrine ROS  Renal/GU negative Renal ROS  negative genitourinary   Musculoskeletal  (+) Arthritis ,   Abdominal   Peds negative pediatric ROS (+)  Hematology negative hematology ROS (+)   Anesthesia Other Findings   Reproductive/Obstetrics negative OB ROS                             Anesthesia Physical Anesthesia Plan  ASA: II  Anesthesia Plan: Spinal   Post-op Pain Management:    Induction: Intravenous  Airway Management Planned: Natural Airway  Additional Equipment:   Intra-op Plan:   Post-operative Plan:   Informed Consent: I have reviewed the patients History and Physical, chart, labs and discussed the procedure including the risks, benefits and alternatives for the proposed anesthesia with the patient or authorized representative who has indicated his/her understanding and acceptance.   Dental advisory given  Plan Discussed with: CRNA  Anesthesia Plan Comments: (Discussed risks and benefits of and differences between spinal and general. Discussed risks of spinal including headache, backache, failure, bleeding and hematoma, infection, and nerve damage. Patient consents to spinal. Questions answered. Coagulation studies and platelet count acceptable.)        Anesthesia Quick  Evaluation

## 2016-01-02 NOTE — Evaluation (Signed)
Physical Therapy Evaluation Patient Details Name: Dean Cannon MRN: [id number] DOB: 1956-05-03 Today's Date: 01/02/2016   History of Present Illness  Pt is a 60 year old male s/p R TKA  Clinical Impression  Pt is s/p R TKA resulting in the deficits listed below (see PT Problem List).  Pt will benefit from skilled PT to increase their independence and safety with mobility to allow discharge to the venue listed below.  Pt tolerated short distance ambulation well POD #0 and plans to d/c home with spouse.  Pt would like to be able to perform flight of steps to upstairs bedroom however also has couch downstairs if necessary.     Follow Up Recommendations Home health PT    Equipment Recommendations  Rolling walker with 5" wheels    Recommendations for Other Services       Precautions / Restrictions Precautions Precautions: Knee Required Braces or Orthoses: Knee Immobilizer - Right Restrictions Other Position/Activity Restrictions: WBAT      Mobility  Bed Mobility Overal bed mobility: Needs Assistance Bed Mobility: Supine to Sit;Sit to Supine     Supine to sit: Min assist Sit to supine: Min assist   General bed mobility comments: assist for R LE  Transfers Overall transfer level: Needs assistance Equipment used: Rolling walker (2 wheeled) Transfers: Sit to/from Stand Sit to Stand: Min guard         General transfer comment: verbal cues for UE and LE positioning  Ambulation/Gait Ambulation/Gait assistance: Min guard Ambulation Distance (Feet): 70 Feet Assistive device: Rolling walker (2 wheeled) Gait Pattern/deviations: Step-to pattern;Antalgic     General Gait Details: verbal cues for sequence, RW positioning, step length  Stairs            Wheelchair Mobility    Modified Rankin (Stroke Patients Only)       Balance                                             Pertinent Vitals/Pain Pain Assessment: 0-10 Pain Score: 1  Pain  Location: R knee Pain Descriptors / Indicators: Sore Pain Intervention(s): Limited activity within patient's tolerance;Monitored during session;Premedicated before session;Repositioned;Ice applied    Home Living Family/patient expects to be discharged to:: Private residence Living Arrangements: Spouse/significant other   Type of Home: House Home Access: Stairs to enter   Secretary/administrator of Steps: 1 Home Layout: Two level Home Equipment: Crutches      Prior Function Level of Independence: Independent               Hand Dominance        Extremity/Trunk Assessment               Lower Extremity Assessment: RLE deficits/detail RLE Deficits / Details: able to perform SLR, ROM TBA       Communication   Communication: No difficulties  Cognition Arousal/Alertness: Awake/alert Behavior During Therapy: WFL for tasks assessed/performed Overall Cognitive Status: Within Functional Limits for tasks assessed                      General Comments      Exercises        Assessment/Plan    PT Assessment Patient needs continued PT services  PT Diagnosis Difficulty walking;Acute pain   PT Problem List Decreased strength;Decreased mobility;Decreased range of motion;Pain;Decreased knowledge of use of DME  PT Treatment Interventions Functional mobility training;Gait training;DME instruction;Stair training;Therapeutic activities;Therapeutic exercise;Patient/family education   PT Goals (Current goals can be found in the Care Plan section) Acute Rehab PT Goals PT Goal Formulation: With patient Time For Goal Achievement: 01/05/16 Potential to Achieve Goals: Good    Frequency 7X/week   Barriers to discharge        Co-evaluation               End of Session Equipment Utilized During Treatment: Gait belt;Right knee immobilizer Activity Tolerance: Patient tolerated treatment well Patient left: in bed;with bed alarm set;with call bell/phone within  reach Nurse Communication: Mobility status         Time: [id number] PT Time Calculation (min) (ACUTE ONLY): 18 min   Charges:   PT Evaluation $PT Eval Low Complexity: 1 Procedure     PT G Codes:        Burlene Montecalvo,KATHrine E 01/02/2016, 4:06 PM Zenovia JarredKati Isebella Upshur, PT, DPT 01/02/2016 Pager: [phone]

## 2016-01-02 NOTE — Interval H&P Note (Signed)
History and Physical Interval Note:  01/02/2016 7:43 AM  Natasha BenceJohn M Ezzell  has presented today for surgery, with the diagnosis of right knee osteoarthritis  The various methods of treatment have been discussed with the patient and family. After consideration of risks, benefits and other options for treatment, the patient has consented to  Procedure(s): RIGHT TOTAL KNEE ARTHROPLASTY (Right) as a surgical intervention .  The patient's history has been reviewed, patient examined, no change in status, stable for surgery.  I have reviewed the patient's chart and labs.  Questions were answered to the patient's satisfaction.     Jodi Criscuolo ANDREW

## 2016-01-02 NOTE — Progress Notes (Signed)
PHARMACIST - PHYSICIAN ORDER COMMUNICATION  CONCERNING: P&T Medication Policy on Herbal Medications  DESCRIPTION:  This patient's order for:  Biotin, glucosamine-chondroitin  has been noted.  This product(s) is classified as an "herbal" or natural product. Due to a lack of definitive safety studies or FDA approval, nonstandard manufacturing practices, plus the potential risk of unknown drug-drug interactions while on inpatient medications, the Pharmacy and Therapeutics Committee does not permit the use of "herbal" or natural products of this type [facility].   ACTION TAKEN: The pharmacy department is unable to verify this order at this time and has been discontinued.  Please reevaluate patient's clinical condition at discharge and address if the herbal or natural product(s) should be resumed at that time.  Adalberto ColeNikola Daeton Kluth, PharmD, BCPS Pager [phone] 01/02/2016 11:53 AM

## 2016-01-02 NOTE — Anesthesia Procedure Notes (Signed)
Spinal  Patient location during procedure: OR Staffing Anesthesiologist: Franne Grip Performed: anesthesiologist  Preanesthetic Checklist Completed: patient identified, site marked, surgical consent, pre-op evaluation, timeout performed, IV checked, risks and benefits discussed and monitors and equipment checked Spinal Block Patient position: sitting Prep: Betadine Patient monitoring: heart rate, continuous pulse ox and blood pressure Approach: midline Location: L3-4 Injection technique: single-shot Needle Needle type: Pencan  Needle gauge: 24 G Needle length: 10 cm Additional Notes Expiration date of kit checked and confirmed. Patient tolerated procedure well, without complications. CSF clear. No paresthesia.

## 2016-01-02 NOTE — Anesthesia Postprocedure Evaluation (Signed)
Anesthesia Post Note  Patient: Dean BenceJohn M Cannon  Procedure(s) Performed: Procedure(s) (LRB): RIGHT TOTAL KNEE ARTHROPLASTY (Right)  Patient location during evaluation: PACU Anesthesia Type: Spinal Level of consciousness: oriented and awake and alert Pain management: pain level controlled Vital Signs Assessment: post-procedure vital signs reviewed and stable Respiratory status: spontaneous breathing, respiratory function stable and patient connected to nasal cannula oxygen Cardiovascular status: blood pressure returned to baseline and stable Postop Assessment: no headache and no backache Anesthetic complications: no    Last Vitals:  Vitals:   01/02/16 1142 01/02/16 1325  BP: 131/82 123/76  Pulse: (!) 53 (!) 54  Resp: 16 18  Temp: 36.5 C 36.5 C    Last Pain:  Vitals:   01/02/16 1325  TempSrc: Oral  PainSc:                  Thresea Doble J

## 2016-01-02 NOTE — Anesthesia Procedure Notes (Signed)
Procedure Name: MAC Date/Time: 01/02/2016 7:52 AM Performed by: Delphia GratesHANDLER, Epsie Walthall Pre-anesthesia Checklist: Patient identified, Emergency Drugs available, Suction available and Patient being monitored Oxygen Delivery Method: Simple face mask Placement Confirmation: positive ETCO2

## 2016-01-03 LAB — CBC
HCT: 35.3 % — ABNORMAL LOW (ref 39.0–52.0)
HEMOGLOBIN: 12 g/dL — AB (ref 13.0–17.0)
MCH: 31.1 pg (ref 26.0–34.0)
MCHC: 34 g/dL (ref 30.0–36.0)
MCV: 91.5 fL (ref 78.0–100.0)
PLATELETS: 196 10*3/uL (ref 150–400)
RBC: 3.86 MIL/uL — ABNORMAL LOW (ref 4.22–5.81)
RDW: 12.6 % (ref 11.5–15.5)
WBC: 12.2 10*3/uL — ABNORMAL HIGH (ref 4.0–10.5)

## 2016-01-03 LAB — BASIC METABOLIC PANEL
Anion gap: 4 — ABNORMAL LOW (ref 5–15)
Anion gap: 5 (ref 5–15)
BUN: 17 mg/dL (ref 6–20)
BUN: 17 mg/dL (ref 6–20)
CALCIUM: 9.1 mg/dL (ref 8.9–10.3)
CALCIUM: 8.9 mg/dL (ref 8.9–10.3)
CHLORIDE: 107 mmol/L (ref 101–111)
CHLORIDE: 106 mmol/L (ref 101–111)
CO2: 29 mmol/L (ref 22–32)
CO2: 29 mmol/L (ref 22–32)
CREATININE: 1.05 mg/dL (ref 0.61–1.24)
CREATININE: 1.08 mg/dL (ref 0.61–1.24)
Glucose, Bld: 128 mg/dL — ABNORMAL HIGH (ref 65–99)
Glucose, Bld: 145 mg/dL — ABNORMAL HIGH (ref 65–99)
Potassium: 5.3 mmol/L — ABNORMAL HIGH (ref 3.5–5.1)
Potassium: 4.6 mmol/L (ref 3.5–5.1)
SODIUM: 140 mmol/L (ref 135–145)
SODIUM: 140 mmol/L (ref 135–145)

## 2016-01-03 MED ORDER — OXYCODONE HCL 5 MG PO TABS
5.0000 mg | ORAL_TABLET | ORAL | Status: DC | PRN
  Administered 2016-01-03 – 2016-01-04 (×5): 10 mg via ORAL
  Filled 2016-01-03 (×4): qty 2

## 2016-01-03 MED ORDER — SODIUM CHLORIDE 0.9 % IV SOLN
INTRAVENOUS | Status: DC
  Administered 2016-01-03: 08:00:00 via INTRAVENOUS

## 2016-01-03 NOTE — Progress Notes (Signed)
Physical Therapy Treatment Patient Details Name: Dean Cannon MRN: [id number] DOB: [birth year] Today's Date: 01/03/2016    History of Present Illness Pt is a 60 year old male s/p R TKA    PT Comments    Pt ambulated in hallway and performed LE exercises.  Pt would like to practice steps this afternoon.  Follow Up Recommendations  Home health PT     Equipment Recommendations  Rolling walker with 5" wheels    Recommendations for Other Services       Precautions / Restrictions Precautions Precautions: Knee Precaution Comments: able to perform SLR Required Braces or Orthoses: Knee Immobilizer - Right Restrictions Other Position/Activity Restrictions: WBAT    Mobility  Bed Mobility Overal bed mobility: Needs Assistance Bed Mobility: Supine to Sit     Supine to sit: Supervision        Transfers Overall transfer level: Needs assistance Equipment used: Rolling walker (2 wheeled) Transfers: Sit to/from Stand Sit to Stand: Min guard         General transfer comment: verbal cues for UE and LE positioning  Ambulation/Gait Ambulation/Gait assistance: Min guard Ambulation Distance (Feet): 120 Feet Assistive device: Rolling walker (2 wheeled) Gait Pattern/deviations: Step-through pattern;Decreased step length - left;Antalgic     General Gait Details: verbal cues for sequence, RW positioning, step length   Stairs            Wheelchair Mobility    Modified Rankin (Stroke Patients Only)       Balance                                    Cognition Arousal/Alertness: Awake/alert Behavior During Therapy: WFL for tasks assessed/performed Overall Cognitive Status: Within Functional Limits for tasks assessed                      Exercises Total Joint Exercises Ankle Circles/Pumps: AROM;Both;10 reps Quad Sets: AROM;Both;10 reps Short Arc Quad: AROM;Right;10 reps Heel Slides: AAROM;Right;Seated;10 reps Hip ABduction/ADduction:  AROM;Right;10 reps Straight Leg Raises: AROM;Right;10 reps    General Comments        Pertinent Vitals/Pain Pain Assessment: 0-10 Pain Score: 3  Pain Location: R knee Pain Descriptors / Indicators: Sore Pain Intervention(s): Premedicated before session;Limited activity within patient's tolerance;Ice applied;Monitored during session    Home Living                      Prior Function            PT Goals (current goals can now be found in the care plan section) Progress towards PT goals: Progressing toward goals    Frequency  7X/week    PT Plan Current plan remains appropriate    Co-evaluation             End of Session   Activity Tolerance: Patient tolerated treatment well Patient left: with call bell/phone within reach;in chair;with chair alarm set     Time: [id number] PT Time Calculation (min) (ACUTE ONLY): 24 min  Charges:  $Gait Training: 8-22 mins $Therapeutic Exercise: 8-22 mins                    G Codes:      Laguana Desautel,KATHrine E 01/03/2016, 12:45 PM Zenovia JarredKati Dorothey Oetken, PT, DPT 01/03/2016 Pager: [phone]

## 2016-01-03 NOTE — Progress Notes (Signed)
Subjective: 1 Day Post-Op Procedure(s) (LRB): RIGHT TOTAL KNEE ARTHROPLASTY (Right) Patient reports pain as mild to right knee.  Reports a good night. Tolerating post-op care. Progressing with PT. Denies SOB, CP, or calf pain.  Objective: Vital signs in last 24 hours: Temp:  [97.4 F (36.3 C)-98 F (36.7 C)] 98 F (36.7 C) (08/30 0537) Pulse Rate:  [48-68] 54 (08/30 0537) Resp:  [11-19] 16 (08/30 0537) BP: (111-143)/(70-82) 136/77 (08/30 0537) SpO2:  [95 %-100 %] 95 % (08/30 0537) Weight:  [96.6 kg (213 lb)] 96.6 kg (213 lb) (08/29 1142)  Intake/Output from previous day: 08/29 0701 - 08/30 0700 In: 3644.5 [P.O.:720; I.V.:2769.5; IV Piggyback:155] Out: 3140 [Urine:2775; Drains:265; Blood:100] Intake/Output this shift: No intake/output data recorded.   Recent Labs  01/03/16 0450  HGB 12.0*    Recent Labs  01/03/16 0450  WBC 12.2*  RBC 3.86*  HCT 35.3*  PLT 196    Recent Labs  01/03/16 0450  NA 140  K 5.3*  CL 107  CO2 29  BUN 17  CREATININE 1.05  GLUCOSE 128*  CALCIUM 9.1   No results for input(s): LABPT, INR in the last 72 hours.  Well nourished. Alert and oriented x3. RRR, Lungs clear, BS x4. Abdomen soft and non tender. Right Calf soft and non tender. Right knee dressing C/D/I. No DVT signs. Compartment soft. No signs of infection.  Right LE grossly neurovascular intact.  Assessment/Plan: 1 Day Post-Op Procedure(s) (LRB): RIGHT TOTAL KNEE ARTHROPLASTY (Right) Up with PT Drain d/c'ed with tip intact. Pt tolerated well. continue current care  Hyperkalemia: D/c IVF with K NS IVF Recheck labs later today Asympyomatic Dean Cannon, Dean Cannon 01/03/2016, 8:13 AM

## 2016-01-03 NOTE — Progress Notes (Signed)
CSW consulted for SNF placement. PN reviewed. PT is recommending HHPT at d/c. RNCM will assist with d/c planning.  Sangeeta Youse LCSW [phone] 

## 2016-01-03 NOTE — Progress Notes (Signed)
Physical Therapy Treatment Note    01/03/16 1500  PT Visit Information  Last PT Received On 01/03/16  Assistance Needed +1  History of Present Illness Pt is a 60 year old male s/p R TKA  Subjective Data  Subjective Pt ambulated good distance in hallway and practiced safe stair technique.  Precautions  Precautions Knee  Precaution Comments able to perform SLR  Restrictions  Other Position/Activity Restrictions WBAT  Pain Assessment  Pain Assessment 0-10  Pain Score 2  Pain Location R knee  Pain Descriptors / Indicators Sore  Pain Intervention(s) Limited activity within patient's tolerance;Monitored during session;Premedicated before session;Repositioned;Ice applied  Cognition  Arousal/Alertness Awake/alert  Behavior During Therapy WFL for tasks assessed/performed  Overall Cognitive Status Within Functional Limits for tasks assessed  Bed Mobility  Overal bed mobility Needs Assistance  Bed Mobility Supine to Sit  Supine to sit Supervision  Sit to supine Supervision  Transfers  Overall transfer level Needs assistance  Equipment used Rolling walker (2 wheeled)  Transfers Sit to/from Stand  Sit to Stand Supervision  General transfer comment verbal cues for UE and LE positioning  Ambulation/Gait  Ambulation/Gait assistance Supervision  Ambulation Distance (Feet) 320 Feet  Assistive device Rolling walker (2 wheeled)  Gait Pattern/deviations Step-through pattern;Decreased step length - left;Antalgic  General Gait Details verbal cues for heel strike, RW positioning, step length  Stairs Yes  Stairs assistance Min guard  Stair Management One rail Left;Step to pattern;With crutches;Forwards  Number of Stairs 4  General stair comments verbal cues for safe technique, sequence  PT - End of Session  Activity Tolerance Patient tolerated treatment well  Patient left in bed;with call bell/phone within reach  PT - Assessment/Plan  PT Plan Current plan remains appropriate  PT Frequency  (ACUTE ONLY) 7X/week  Follow Up Recommendations Home health PT  PT equipment Rolling walker with 5" wheels  PT Goal Progression  Progress towards PT goals Progressing toward goals  PT Time Calculation  PT Start Time (ACUTE ONLY) 1405  PT Stop Time (ACUTE ONLY) 1424  PT Time Calculation (min) (ACUTE ONLY) 19 min  PT General Charges  $$ ACUTE PT VISIT 1 Procedure  PT Treatments  $Gait Training 8-22 mins   Zenovia JarredKati Lorae Roig, PT, DPT 01/03/2016 Pager: [phone]

## 2016-01-03 NOTE — Care Management Note (Signed)
Case Management Note  Patient Details  Name: Dean BenceJohn M Cannon MRN: [id number] Date of Birth: [birth year]  Subjective/Objective: 60 y.o. M admitted 01/02/2016 for R TKA. PT has recommended HHPT and pt has chosen [facility] to provide this care. Writer has spoken with K.N., [facility] rep to assure these arrangements. [name] will deliver RW today. No further CM needs.                    Action/Plan: Anticipate discharge home today. No further CM needs but will be available should additional discharge needs arise.   Expected Discharge Date:                  Expected Discharge Plan:  Home w Home Health Services  In-House Referral:  NA  Discharge planning Services  CM Consult  Post Acute Care Choice:  Durable Medical Equipment Choice offered to:  Patient, Spouse  DME Arranged:  Walker rolling DME Agency:  [facility]  HH Arranged:  PT HH Agency:  [facility]  Status of Service:  Completed, signed off  If discussed at Long Length of Stay Meetings, dates discussed:    Additional Comments:  [name], [name], RN 01/03/2016, 10:01 AM

## 2016-01-03 NOTE — Progress Notes (Signed)
Occupational Therapy Evaluation Patient Details Name: Natasha BenceJohn M Mayden MRN: [id number] DOB: 10-24-1955 Today's Date: 01/03/2016    History of Present Illness Pt is a 60 year old male s/p R TKA   Clinical Impression   All OT education completed and pt questions answered. No further OT needs at this time. Will sign off.    Follow Up Recommendations  No OT follow up    Equipment Recommendations  None recommended by OT -- patient declines 3 in 1 at this time   Recommendations for Other Services       Precautions / Restrictions Precautions Precautions: Knee Precaution Comments: able to perform SLR Required Braces or Orthoses: Knee Immobilizer - Right Restrictions Other Position/Activity Restrictions: WBAT      Mobility Bed Mobility               Transfers                 Balance                                            ADL Overall ADL's : Needs assistance/impaired                                       General ADL Comments: RW already delivered to room. Discussed potential need for 3 in 1 and he states he thinks he will be OK with toilet transfer at home. Educated them that if he gets home and then decides he needs it to contact MD. They verbalized understanding.  Verbal education/demo of shower transfer and they verbalized understanding but declined to practice. Patient will have assistance of family at home.     Vision     Perception     Praxis      Pertinent Vitals/Pain Pain Assessment: No/denies pain Pain Score: 3  Pain Location: R knee Pain Descriptors / Indicators: Sore Pain Intervention(s): Premedicated before session;Limited activity within patient's tolerance;Ice applied;Monitored during session     Hand Dominance     Extremity/Trunk Assessment Upper Extremity Assessment Upper Extremity Assessment: Overall WFL for tasks assessed   Lower Extremity Assessment Lower Extremity Assessment: Defer to  PT evaluation       Communication Communication Communication: No difficulties   Cognition Arousal/Alertness: Awake/alert Behavior During Therapy: WFL for tasks assessed/performed Overall Cognitive Status: Within Functional Limits for tasks assessed                     General Comments       Exercises       Shoulder Instructions      Home Living Family/patient expects to be discharged to:: Private residence Living Arrangements: Spouse/significant other Available Help at Discharge: Family Type of Home: House Home Access: Stairs to enter Secretary/administratorntrance Stairs-Number of Steps: 1   Home Layout: Two level Alternate Level Stairs-Number of Steps: 14 Alternate Level Stairs-Rails: Left Bathroom Shower/Tub: Producer, television/film/videoWalk-in shower   Bathroom Toilet: Standard Bathroom Accessibility: Yes How Accessible: Accessible via walker Home Equipment: Crutches          Prior Functioning/Environment Level of Independence: Independent             OT Diagnosis: Acute pain   OT Problem List: Decreased strength;Decreased range of motion;Decreased knowledge of use of DME or AE;Pain  OT Treatment/Interventions:      OT Goals(Current goals can be found in the care plan section) Acute Rehab OT Goals Patient Stated Goal: home tomorrow OT Goal Formulation: All assessment and education complete, DC therapy  OT Frequency:     Barriers to D/C:            Co-evaluation              End of Session    Activity Tolerance: Patient tolerated treatment well Patient left: in bed;with call bell/phone within reach;with family/visitor present   Time: 6962-9528 OT Time Calculation (min): 10 min Charges:  OT General Charges $OT Visit: 1 Procedure OT Evaluation $OT Eval Low Complexity: 1 Procedure G-Codes:    Laylah Riga A January 12, 2016, 1:04 PM

## 2016-01-04 LAB — CBC
HEMATOCRIT: 31.4 % — AB (ref 39.0–52.0)
HEMOGLOBIN: 10.7 g/dL — AB (ref 13.0–17.0)
MCH: 31.7 pg (ref 26.0–34.0)
MCHC: 34.1 g/dL (ref 30.0–36.0)
MCV: 92.9 fL (ref 78.0–100.0)
Platelets: 182 10*3/uL (ref 150–400)
RBC: 3.38 MIL/uL — ABNORMAL LOW (ref 4.22–5.81)
RDW: 12.8 % (ref 11.5–15.5)
WBC: 8.4 10*3/uL (ref 4.0–10.5)

## 2016-01-04 NOTE — Discharge Summary (Signed)
Physician Discharge Summary  Patient ID: RIDWAN BONDY MRN: [id number] DOB/AGE: 07-08-1955 60 y.o.  Admit date: 01/02/2016 Discharge date: 01/04/2016  Admission Diagnoses: righ tknee OA primary  Discharge Diagnoses:  Active Problems:   S/P knee replacement   Discharged Condition: good  Hospital Course:  Dean Cannon is a 60 y.o. who was admitted to [facility]. They were brought to the operating room on 01/02/2016 and underwent Procedure(s): RIGHT TOTAL KNEE ARTHROPLASTY.  Patient tolerated the procedure well and was later transferred to the recovery room and then to the orthopaedic floor for postoperative care.  They were given PO and IV analgesics for pain control following their surgery.  They were given 24 hours of postoperative antibiotics of  Anti-infectives    Start     Dose/Rate Route Frequency Ordered Stop   01/02/16 1400  ceFAZolin (ANCEF) IVPB 2g/100 mL premix     2 g 200 mL/hr over 30 Minutes Intravenous Every 6 hours 01/02/16 1142 01/02/16 2028   01/02/16 0531  ceFAZolin (ANCEF) IVPB 2g/100 mL premix     2 g 200 mL/hr over 30 Minutes Intravenous On call to O.R. 01/02/16 4098 01/02/16 0805     and started on DVT prophylaxis in the form of lovenox.   PT and OT were ordered for total joint protocol.  Discharge planning consulted to help with postop disposition and equipment needs.  Patient had a good night on the evening of surgery and started to get up OOB with therapy on day one.  Hemovac drain was pulled without difficulty.  Continued to work with therapy into day two.  Dressing was with normal limits.  The patient had progressed with therapy and meeting their goals. Patient was seen in rounds and was ready to go home.  Consults: n/a  Significant Diagnostic Studies: routine  Treatments: routine  Discharge Exam: Blood pressure 131/69, pulse 60, temperature 98.5 F (36.9 C), temperature source Oral, resp. rate 16, height 6\' 1"  (1.854 m), weight 96.6 kg (213  lb), SpO2 98 %. Well nourished. Alert and oriented x3. RRR, Lungs clear, BS x4. Abdomen soft and non tender. Right Calf soft and non tender. Right knee dressing C/D/I. No DVT signs. Compartment soft. No signs of infection.  Right LE grossly neurovascular intact.  Disposition: Final discharge disposition not confirmed  Discharge Instructions    Call MD / Call 911    Complete by:  As directed   If you experience chest pain or shortness of breath, CALL 911 and be transported to the hospital emergency room.  If you develope a fever above 101 F, pus (white drainage) or increased drainage or redness at the wound, or calf pain, call your surgeon's office.   Constipation Prevention    Complete by:  As directed   Drink plenty of fluids.  Prune juice may be helpful.  You may use a stool softener, such as Colace (over the counter) 100 mg twice a day.  Use MiraLax (over the counter) for constipation as needed.   Diet - low sodium heart healthy    Complete by:  As directed   Discharge instructions    Complete by:  As directed   INSTRUCTIONS AFTER JOINT REPLACEMENT   Remove items at home which could result in a fall. This includes throw rugs or furniture in walking pathways ICE to the affected joint every three hours while awake for 30 minutes at a time, for at least the first 3-5 days, and then as needed for pain  and swelling.  Continue to use ice for pain and swelling. You may notice swelling that will progress down to the foot and ankle.  This is normal after surgery.  Elevate your leg when you are not up walking on it.   Continue to use the breathing machine you got in the hospital (incentive spirometer) which will help keep your temperature down.  It is common for your temperature to cycle up and down following surgery, especially at night when you are not up moving around and exerting yourself.  The breathing machine keeps your lungs expanded and your temperature down.   DIET:  As you were doing prior to  hospitalization, we recommend a well-balanced diet.  DRESSING / WOUND CARE / SHOWERING  Keep the surgical dressing until follow up.  The dressing is water proof, so you can shower without any extra covering.  IF THE DRESSING FALLS OFF or the wound gets wet inside, change the dressing with sterile gauze.  Please use good hand washing techniques before changing the dressing.  Do not use any lotions or creams on the incision until instructed by your surgeon.    ACTIVITY  Increase activity slowly as tolerated, but follow the weight bearing instructions below.   No driving for 6 weeks or until further direction given by your physician.  You cannot drive while taking narcotics.  No lifting or carrying greater than 10 lbs. until further directed by your surgeon. Avoid periods of inactivity such as sitting longer than an hour when not asleep. This helps prevent blood clots.  You may return to work once you are authorized by your doctor.     WEIGHT BEARING   Weight bearing as tolerated with assist device (walker, cane, etc) as directed, use it as long as suggested by your surgeon or therapist, typically at least 4-6 weeks.   EXERCISES  Results after joint replacement surgery are often greatly improved when you follow the exercise, range of motion and muscle strengthening exercises prescribed by your doctor. Safety measures are also important to protect the joint from further injury. Any time any of these exercises cause you to have increased pain or swelling, decrease what you are doing until you are comfortable again and then slowly increase them. If you have problems or questions, call your caregiver or physical therapist for advice.   Rehabilitation is important following a joint replacement. After just a few days of immobilization, the muscles of the leg can become weakened and shrink (atrophy).  These exercises are designed to build up the tone and strength of the thigh and leg muscles and to  improve motion. Often times heat used for twenty to thirty minutes before working out will loosen up your tissues and help with improving the range of motion but do not use heat for the first two weeks following surgery (sometimes heat can increase post-operative swelling).   These exercises can be done on a training (exercise) mat, on the floor, on a table or on a bed. Use whatever works the best and is most comfortable for you.    Use music or television while you are exercising so that the exercises are a pleasant break in your day. This will make your life better with the exercises acting as a break in your routine that you can look forward to.   Perform all exercises about fifteen times, three times per day or as directed.  You should exercise both the operative leg and the other leg as well.  Exercises include:   Quad Sets - Tighten up the muscle on the front of the thigh (Quad) and hold for 5-10 seconds.   Straight Leg Raises - With your knee straight (if you were given a brace, keep it on), lift the leg to 60 degrees, hold for 3 seconds, and slowly lower the leg.  Perform this exercise against resistance later as your leg gets stronger.  Leg Slides: Lying on your back, slowly slide your foot toward your buttocks, bending your knee up off the floor (only go as far as is comfortable). Then slowly slide your foot back down until your leg is flat on the floor again.  Angel Wings: Lying on your back spread your legs to the side as far apart as you can without causing discomfort.  Hamstring Strength:  Lying on your back, push your heel against the floor with your leg straight by tightening up the muscles of your buttocks.  Repeat, but this time bend your knee to a comfortable angle, and push your heel against the floor.  You may put a pillow under the heel to make it more comfortable if necessary.   A rehabilitation program following joint replacement surgery can speed recovery and prevent re-injury  in the future due to weakened muscles. Contact your doctor or a physical therapist for more information on knee rehabilitation.    CONSTIPATION  Constipation is defined medically as fewer than three stools per week and severe constipation as less than one stool per week.  Even if you have a regular bowel pattern at home, your normal regimen is likely to be disrupted due to multiple reasons following surgery.  Combination of anesthesia, postoperative narcotics, change in appetite and fluid intake all can affect your bowels.   YOU MUST use at least one of the following options; they are listed in order of increasing strength to get the job done.  They are all available over the counter, and you may need to use some, POSSIBLY even all of these options:    Drink plenty of fluids (prune juice may be helpful) and high fiber foods Colace 100 mg by mouth twice a day  Senokot for constipation as directed and as needed Dulcolax (bisacodyl), take with full glass of water  Miralax (polyethylene glycol) once or twice a day as needed.  If you have tried all these things and are unable to have a bowel movement in the first 3-4 days after surgery call either your surgeon or your primary doctor.    If you experience loose stools or diarrhea, hold the medications until you stool forms back up.  If your symptoms do not get better within 1 week or if they get worse, check with your doctor.  If you experience "the worst abdominal pain ever" or develop nausea or vomiting, please contact the office immediately for further recommendations for treatment.   ITCHING:  If you experience itching with your medications, try taking only a single pain pill, or even half a pain pill at a time.  You can also use Benadryl over the counter for itching or also to help with sleep.   TED HOSE STOCKINGS:  Use stockings on both legs until for at least 2 weeks or as directed by physician office. They may be removed at night for  sleeping.  MEDICATIONS:  See your medication summary on the "After Visit Summary" that nursing will review with you.  You may have some home medications which will be placed on hold until  you complete the course of blood thinner medication.  It is important for you to complete the blood thinner medication as prescribed.  PRECAUTIONS:  If you experience chest pain or shortness of breath - call 911 immediately for transfer to the hospital emergency department.   If you develop a fever greater that 101 F, purulent drainage from wound, increased redness or drainage from wound, foul odor from the wound/dressing, or calf pain - CONTACT YOUR SURGEON.                                                   FOLLOW-UP APPOINTMENTS:  If you do not already have a post-op appointment, please call the office for an appointment to be seen by your surgeon.  Guidelines for how soon to be seen are listed in your "After Visit Summary", but are typically between 1-4 weeks after surgery.  OTHER INSTRUCTIONS:   Knee Replacement:  Do not place pillow under knee, focus on keeping the knee straight while resting. CPM instructions: 0-90 degrees, 2 hours in the morning, 2 hours in the afternoon, and 2 hours in the evening. Place foam block, curve side up under heel at all times except when in CPM or when walking.  DO NOT modify, tear, cut, or change the foam block in any way.  MAKE SURE YOU:  Understand these instructions.  Get help right away if you are not doing well or get worse.    Thank you for letting us be a part of your medical care team.  It is a privilege we respect greatly.  We hope these instructions will help you stay on track for a fast and full recovery!   Increase activity slowly as tolerated    Complete by:  As directed       Medication List    TAKE these medications   amphetamine-dextroamphetamine 20 MG tablet Commonly known as:  ADDERALL Take 20 mg by mouth daily.   aspirin EC 325 MG tablet Take 1  tablet (325 mg total) by mouth 2 (two) times daily.   Biotin 10 MG Caps Take 10 mg by mouth daily.   escitalopram 10 MG tablet Commonly known as:  LEXAPRO Take 10 mg by mouth daily.   glucosamine-chondroitin 500-400 MG tablet Take 1 tablet by mouth 3 (three) times daily.   methocarbamol 500 MG tablet Commonly known as:  ROBAXIN Take 1 tablet (500 mg total) by mouth every 8 (eight) hours as needed for muscle spasms.   naproxen sodium 220 MG tablet Commonly known as:  ANAPROX Take 220 mg by mouth 4 (four) times daily. Patient states he usually takes 4 pills a day   oxyCODONE-acetaminophen 5-325 MG tablet Commonly known as:  ROXICET Take 1-2 tablets by mouth every 4 (four) hours as needed for severe pain.      Follow-up Information    [facility] - [facility] .   Why:  A RW has been provided as your DME Midwife(Durable Medical Equipment). Call the above number for any questions or concerns about your RW.  Contact information: [address] [place] [id number] [phone]        [facility] Follow up today.   Why:  HHPT will be provided by the above agency. A representative will be in touch within 24-48 hours to make arrangements for  your initial PT visit.  Contact information: [address] [place] [address] [phone]           Signed: Markham Jordan 01/04/2016, 7:40 AM

## 2016-01-04 NOTE — Progress Notes (Signed)
Discharged from floor via w/c for transport home by car. Wife & belongings with pt. No changes in assessment. Dean Cannon   

## 2016-01-04 NOTE — Progress Notes (Signed)
Discharge instructions reviewed with pt by student nurse, understanding voiced. Dean Cannon, [name]

## 2016-01-04 NOTE — Progress Notes (Signed)
Subjective: 2 Days Post-Op Procedure(s) (LRB): RIGHT TOTAL KNEE ARTHROPLASTY (Right) Patient reports pain as moderate. Tolerating PO's.   Reports a good night. Denies SOB, CP, or calf pain.   Objective: Vital signs in last 24 hours: Temp:  [97.7 F (36.5 C)-98.6 F (37 C)] 98.5 F (36.9 C) (08/31 0620) Pulse Rate:  [56-67] 60 (08/31 0620) Resp:  [16-20] 16 (08/31 0620) BP: (131-147)/(69-76) 131/69 (08/31 0620) SpO2:  [94 %-99 %] 98 % (08/31 0620)  Intake/Output from previous day: 08/30 0701 - 08/31 0700 In: 1680 [P.O.:1680] Out: 575 [Urine:575] Intake/Output this shift: No intake/output data recorded.   Recent Labs  01/03/16 0450 01/04/16 0426  HGB 12.0* 10.7*    Recent Labs  01/03/16 0450 01/04/16 0426  WBC 12.2* 8.4  RBC 3.86* 3.38*  HCT 35.3* 31.4*  PLT 196 182    Recent Labs  01/03/16 0450 01/03/16 1632  NA 140 140  K 5.3* 4.6  CL 107 106  CO2 29 29  BUN 17 17  CREATININE 1.05 1.08  GLUCOSE 128* 145*  CALCIUM 9.1 8.9   No results for input(s): LABPT, INR in the last 72 hours.  Well nourished. Alert and oriented x3. RRR, Lungs clear, BS x4. Abdomen soft and non tender. Right Calf soft and non tender. Right knee dressing C/D/I. No DVT signs. Compartment soft. No signs of infection.  Right LE grossly neurovascular intact.  Assessment/Plan: 2 Days Post-Op TKA Up with PT D/c home with family F/u in  2weeks Follow instructions  Leilene Diprima L 01/04/2016, 7:34 AM

## 2016-01-04 NOTE — Progress Notes (Signed)
Physical Therapy Treatment Note    01/04/16 1100  PT Visit Information  Last PT Received On 01/04/16  Assistance Needed +1  History of Present Illness Pt is a 60 year old male s/p R TKA  Subjective Data  Subjective Pt ambulated good distance in hallway and practiced safe stair technique again today.  Pt feels comfortable with d/c home today.  Verbally reviewed exercises which pt reports he will perform once home (save energy for getting home).  Pt had no further questions.  Precautions  Precautions Knee  Precaution Comments able to perform SLR  Restrictions  Other Position/Activity Restrictions WBAT  Pain Assessment  Pain Assessment 0-10  Pain Score 1  Pain Location R knee  Pain Descriptors / Indicators Sore  Pain Intervention(s) Limited activity within patient's tolerance;Premedicated before session;Monitored during session;Repositioned  Cognition  Arousal/Alertness Awake/alert  Behavior During Therapy WFL for tasks assessed/performed  Overall Cognitive Status Within Functional Limits for tasks assessed  Bed Mobility  Overal bed mobility Needs Assistance  Bed Mobility Supine to Sit  Supine to sit Supervision  Sit to supine Supervision  Transfers  Overall transfer level Needs assistance  Equipment used Rolling walker (2 wheeled)  Transfers Sit to/from Stand  Sit to Stand Supervision  General transfer comment verbal cues for UE and LE positioning  Ambulation/Gait  Ambulation/Gait assistance Supervision  Ambulation Distance (Feet) 320 Feet  Assistive device Rolling walker (2 wheeled)  Gait Pattern/deviations Step-through pattern;Decreased step length - left;Antalgic  General Gait Details verbal cues for heel strike, RW positioning, step length  Stairs Yes  Stairs assistance Supervision  Stair Management One rail Left;Forwards;With crutches;Step to pattern  Number of Stairs 4  General stair comments verbal cues for safe technique, sequence  PT - End of Session  Activity  Tolerance Patient tolerated treatment well  Patient left in bed;with call bell/phone within reach  PT - Assessment/Plan  PT Plan Current plan remains appropriate  PT Frequency (ACUTE ONLY) 7X/week  Follow Up Recommendations Home health PT  PT equipment Rolling walker with 5" wheels  PT Goal Progression  Progress towards PT goals Progressing toward goals  PT Time Calculation  PT Start Time (ACUTE ONLY) 0857  PT Stop Time (ACUTE ONLY) 0915  PT Time Calculation (min) (ACUTE ONLY) 18 min  PT General Charges  $$ ACUTE PT VISIT 1 Procedure  PT Treatments  $Gait Training 8-22 mins   Zenovia JarredKati Melodee Lupe, PT, DPT 01/04/2016 Pager: [phone]

## 2016-06-05 ENCOUNTER — Other Ambulatory Visit: Payer: Self-pay | Admitting: *Deleted

## 2016-06-05 DIAGNOSIS — R600 Localized edema: Secondary | ICD-10-CM

## 2016-06-20 ENCOUNTER — Encounter: Payer: Self-pay | Admitting: Internal Medicine

## 2016-06-20 ENCOUNTER — Ambulatory Visit (INDEPENDENT_AMBULATORY_CARE_PROVIDER_SITE_OTHER): Payer: Managed Care, Other (non HMO) | Admitting: Internal Medicine

## 2016-06-20 VITALS — BP 124/68 | HR 68 | Temp 98.0°F | Resp 16 | Ht 73.0 in | Wt 215.0 lb

## 2016-06-20 DIAGNOSIS — F900 Attention-deficit hyperactivity disorder, predominantly inattentive type: Secondary | ICD-10-CM

## 2016-06-20 DIAGNOSIS — S6991XA Unspecified injury of right wrist, hand and finger(s), initial encounter: Secondary | ICD-10-CM

## 2016-06-20 DIAGNOSIS — Z96651 Presence of right artificial knee joint: Secondary | ICD-10-CM | POA: Diagnosis not present

## 2016-06-20 DIAGNOSIS — E785 Hyperlipidemia, unspecified: Secondary | ICD-10-CM | POA: Diagnosis not present

## 2016-06-20 NOTE — Progress Notes (Signed)
Assessment and Plan:   1. Injury of finger of right hand, initial encounter -history of recurring paronychia -if xray positive for foreign body we can remove - DG Finger Ring Right; Future  2. Attention deficit hyperactivity disorder (ADHD), predominantly inattentive type -in future will take over medication from Dr. Evelene Croon -patient aware to take drug holidays -[place] drug database reviewed no evidence of abuse  3. Hyperlipidemia LDL goal <130 -currently at goal -conservative therapy of diet and exercise  4. Status post right knee replacement -doing exceedingly well    Continue diet and meds as discussed. Further disposition pending results of labs.  HPI 61 y.o. male  presents for 6 month follow up after recent right total knee replacement.  He reports that he has been doing really well.  He is mad that he didn't do his surgery prior.    He has had a recurring paronychia to his right ring finger which comes and goes.  He uses warm water soaks.  He believes that there is something in there and this is why it keeps coming back.  He reports that he was at one time bit by a fish and this is when it started.  He would like for this to be taken care of.    He is also wondering whether we can take over prescribing his psych medications which are currently being filled by Dr. Evelene Croon.  [place] drug database shows no evidence of abuse.  He does not take it when he is not working although when at work he finds it helpful so he can stay on task.    Current Medications:  Current Outpatient Prescriptions on File Prior to Visit  Medication Sig Dispense Refill  . amphetamine-dextroamphetamine (ADDERALL) 20 MG tablet Take 20 mg by mouth daily.     . Biotin 10 MG CAPS Take 10 mg by mouth daily.    Marland Kitchen escitalopram (LEXAPRO) 10 MG tablet Take 10 mg by mouth daily.    Marland Kitchen glucosamine-chondroitin 500-400 MG tablet Take 1 tablet by mouth 3 (three) times daily.     No current facility-administered medications on file  prior to visit.     Medical History:  Past Medical History:  Diagnosis Date  . ADD (attention deficit disorder)   . Arthritis   . Hyperlipidemia LDL goal <130 12/13/2015    Allergies: No Known Allergies   Review of Systems:  Review of Systems  Constitutional: Negative for chills, fever and malaise/fatigue.  HENT: Negative for congestion, ear pain and sore throat.   Eyes: Negative.   Respiratory: Negative for cough, shortness of breath and wheezing.   Cardiovascular: Negative for chest pain, palpitations and leg swelling.  Gastrointestinal: Negative for abdominal pain, blood in stool, constipation, diarrhea, heartburn and melena.  Genitourinary: Negative.   Skin: Negative.   Neurological: Negative for dizziness, sensory change, loss of consciousness and headaches.  Psychiatric/Behavioral: Negative for depression. The patient is not nervous/anxious and does not have insomnia.     Family history- Review and unchanged  Social history- Review and unchanged  Physical Exam: BP 124/68   Pulse 68   Temp 98 F (36.7 C) (Temporal)   Resp 16   Ht 6\' 1"  (1.854 m)   Wt 215 lb (97.5 kg)   BMI 28.37 kg/m  Wt Readings from Last 3 Encounters:  06/20/16 215 lb (97.5 kg)  01/02/16 213 lb (96.6 kg)  12/22/15 213 lb 2 oz (96.7 kg)    General Appearance: Well nourished well developed, in no  apparent distress. Eyes: PERRLA, EOMs, conjunctiva no swelling or erythema ENT/Mouth: Ear canals normal without obstruction, swelling, erythma, discharge.  TMs normal bilaterally.  Oropharynx moist, clear, without exudate, or postoropharyngeal swelling. Neck: Supple, thyroid normal,no cervical adenopathy  Respiratory: Respiratory effort normal, Breath sounds clear A&P without rhonchi, wheeze, or rale.  No retractions, no accessory usage. Cardio: RRR with no MRGs. Brisk peripheral pulses without edema.  Abdomen: Soft, + BS,  Non tender, no guarding, rebound, hernias, masses. Musculoskeletal: Full ROM,  5/5 strength, Normal gait Skin: Warm, dry without rashes, lesions, ecchymosis.  Neuro: Awake and oriented X 3, Cranial nerves intact. Normal muscle tone, no cerebellar symptoms. Psych: Normal affect, Insight and Judgment appropriate.    Chayson Charters Forcucci, PA-C 4:27 PM [facility]

## 2016-06-26 ENCOUNTER — Encounter: Payer: Self-pay | Admitting: Vascular Surgery

## 2016-06-27 ENCOUNTER — Ambulatory Visit (HOSPITAL_COMMUNITY)
Admission: RE | Admit: 2016-06-27 | Discharge: 2016-06-27 | Disposition: A | Discharge status: Home / Self Care | Payer: Managed Care, Other (non HMO) | Source: Ambulatory Visit | Attending: Internal Medicine | Admitting: Internal Medicine

## 2016-06-27 DIAGNOSIS — L03011 Cellulitis of right finger: Secondary | ICD-10-CM | POA: Insufficient documentation

## 2016-06-27 DIAGNOSIS — S6991XA Unspecified injury of right wrist, hand and finger(s), initial encounter: Secondary | ICD-10-CM

## 2016-07-02 ENCOUNTER — Encounter: Payer: Self-pay | Admitting: Vascular Surgery

## 2016-07-02 ENCOUNTER — Ambulatory Visit (HOSPITAL_COMMUNITY)
Admission: RE | Admit: 2016-07-02 | Discharge: 2016-07-02 | Disposition: A | Discharge status: Home / Self Care | Payer: Managed Care, Other (non HMO) | Source: Ambulatory Visit | Attending: Vascular Surgery | Admitting: Vascular Surgery

## 2016-07-02 ENCOUNTER — Ambulatory Visit (INDEPENDENT_AMBULATORY_CARE_PROVIDER_SITE_OTHER): Payer: Managed Care, Other (non HMO) | Admitting: Vascular Surgery

## 2016-07-02 VITALS — BP 145/80 | HR 56 | Temp 98.0°F | Resp 16 | Ht 74.0 in | Wt 215.0 lb

## 2016-07-02 DIAGNOSIS — R600 Localized edema: Secondary | ICD-10-CM | POA: Diagnosis present

## 2016-07-02 DIAGNOSIS — M7989 Other specified soft tissue disorders: Secondary | ICD-10-CM

## 2016-07-02 NOTE — Progress Notes (Signed)
[facility]ist of [place]  Patient name: Dean Cannon MRN: [id number] DOB: 08/12/1955 Sex: male  REASON FOR CONSULT: Evaluation swelling lower extremity  HPI: Dean Cannon is a 61 y.o. male, who is here today for evaluation of lower extremity swelling. He reports initially was noted to have swelling in his right leg prior to total knee replacement months ago. He had a negative DVT study at that time. This slowly resolved and he underwent uneventful total knee replacement. Over the last several months he is having concern regarding swelling in his left leg. Minimal swelling in his right leg. No history of trauma or DVT in the left leg. No history of congestive heart failure volume overload  Past Medical History:  Diagnosis Date  . ADD (attention deficit disorder)   . Arthritis   . Hyperlipidemia LDL goal <130 12/13/2015    Family History  Problem Relation Age of Onset  . Cancer Mother     Lung Cancer  . Cancer Father     Blood dyscrasia  . Heart disease Father 72    Heart attack   . Diabetes Brother   . Cancer Maternal Grandmother     GI cancer  . Cancer Maternal Grandfather   . Cancer Paternal Grandmother   . Cancer Paternal Grandfather     SOCIAL HISTORY: Social History   Social History  . Marital status: Married    Spouse name: N/A  . Number of children: N/A  . Years of education: N/A   Occupational History  . Not on file.   Social History Main Topics  . Smoking status: Former Smoker    Packs/day: 0.50    Years: 3.00    Types: Cigarettes    Quit date: 05/06/1978  . Smokeless tobacco: Former Neurosurgeon    Types: Chew  . Alcohol use 7.2 oz/week    12 Cans of beer per week     Comment: 12 cans of beer weekly   . Drug use: No  . Sexual activity: Yes   Other Topics Concern  . Not on file   Social History Narrative  . No narrative on file    No Known Allergies  Current Outpatient Prescriptions  Medication  Sig Dispense Refill  . amphetamine-dextroamphetamine (ADDERALL) 20 MG tablet Take 20 mg by mouth daily.     . Biotin 10 MG CAPS Take 10 mg by mouth daily.    Marland Kitchen escitalopram (LEXAPRO) 10 MG tablet Take 10 mg by mouth daily.    Marland Kitchen glucosamine-chondroitin 500-400 MG tablet Take 1 tablet by mouth 3 (three) times daily.     No current facility-administered medications for this visit.     REVIEW OF SYSTEMS:  [X]  denotes positive finding, [ ]  denotes negative finding Cardiac  Comments:  Chest pain or chest pressure:    Shortness of breath upon exertion:    Short of breath when lying flat:    Irregular heart rhythm:        Vascular    Pain in calf, thigh, or hip brought on by ambulation:    Pain in feet at night that wakes you up from your sleep:     Blood clot in your veins:    Leg swelling:  x       Pulmonary    Oxygen at home:    Productive cough:     Wheezing:         Neurologic    Sudden weakness in arms or legs:  Sudden numbness in arms or legs:     Sudden onset of difficulty speaking or slurred speech:    Temporary loss of vision in one eye:     Problems with dizziness:         Gastrointestinal    Blood in stool:     Vomited blood:         Genitourinary    Burning when urinating:     Blood in urine:        Psychiatric    Major depression:         Hematologic    Bleeding problems:    Problems with blood clotting too easily:        Skin    Rashes or ulcers:        Constitutional    Fever or chills:      PHYSICAL EXAM: Vitals:   07/02/16 1455  BP: (!) 145/80  Pulse: (!) 56  Resp: 16  Temp: 98 F (36.7 C)  SpO2: 96%  Weight: 215 lb (97.5 kg)  Height: 6\' 2"  (1.88 m)    GENERAL: The patient is a well-nourished male, in no acute distress. The vital signs are documented above. CARDIOVASCULAR: 2+ radial and 2+ dorsalis pedis pulses bilaterally PULMONARY: There is good air exchange  ABDOMEN: Soft and non-tender  MUSCULOSKELETAL: There are no major  deformities or cyanosis. Well-healed incision over his right knee from total knee replacement NEUROLOGIC: No focal weakness or paresthesias are detected. SKIN: There are no ulcers or rashes noted. PSYCHIATRIC: The patient has a normal affect. Does have some moderate swelling left leg versus right. No changes of venous hypertension such as hemosiderin deposit or brawny edema   DATA:  She underwent venous duplex repeat plucks study today. This shows no evidence of obstruction or DVT. He does have reflux throughout his common femoral vein, femoral vein and popliteal vein. No superficial reflux in his great or small saphenous vein  MEDICAL ISSUES: I discussed the significance of this with patient. Explained this is not a dangerous situation. Did explain the only treatment option for this would be elevation and compression. He is not having severe swelling currently. I did explain that this is generally slowly progressive. He does a great deal of travel and car and sits for prolonged period of time. I have recommended that he wear knee-high 20-30 mmHg compression garments when possible to control the swelling and reduce his risk for progression of chronic venous stasis disease. He understands and will see us again on an as-needed basis   Larina Earthlyodd F. Deberah Adolf, MD [facility] Office Tel [phone] Pager [phone]

## 2016-09-17 ENCOUNTER — Encounter: Payer: Self-pay | Admitting: *Deleted

## 2016-09-26 ENCOUNTER — Encounter: Payer: Self-pay | Admitting: *Deleted

## 2016-10-24 ENCOUNTER — Ambulatory Visit: Payer: Self-pay | Admitting: Internal Medicine

## 2016-12-04 ENCOUNTER — Ambulatory Visit: Payer: Self-pay | Admitting: Physician Assistant

## 2016-12-24 NOTE — Progress Notes (Signed)
FOLLOW UP  Assessment and Plan:  Hyperlipidemia LDL goal <130 -     CBC with Differential/Platelet -     BASIC METABOLIC PANEL WITH GFR -     Hepatic function panel -     TSH -     Lipid panel  Attention deficit hyperactivity disorder (ADHD), predominantly inattentive type -     TSH  Medication management -     Magnesium  Night sweats -     TSH -     Testosterone - recent normal colonoscopy - no weight loss - states has been for years - will get CXR at [facility]   Discussed med's effects and SE's. Screening labs and tests as requested with regular follow-up as recommended. Over 30 minutes of exam, counseling, chart review and critical decision making was performed Future Appointments Date Time Provider Department Center  06/26/2017 4:30 PM Quentin Mulling, PA-C [facility] None    HPI Patient presents for 4-5 month follow up.   His blood pressure has been controlled at home, today their BP is BP: 130/82 He does not workout. He denies chest pain, shortness of breath, dizziness.  Married x 36 years, sells chemotherapy agent.  Patient is on an ADD medication, he states that the medication is helping and he denies any adverse reactions. Follows with Dr. Evelene Croon.  He has bump on R finger, normal xray, wife will open and will have jelly like substance come out.  He is on lexapro for depression/anxiety.  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  Has had night sweats x 3 years BMI is Body mass index is 28.89 kg/m., he is working on diet and exercise. Wt Readings from Last 3 Encounters:  12/25/16 219 lb (99.3 kg)  07/02/16 215 lb (97.5 kg)  06/20/16 215 lb (97.5 kg)     Current Medications:  Current Outpatient Prescriptions on File Prior to Visit  Medication Sig Dispense Refill  . amphetamine-dextroamphetamine (ADDERALL) 20 MG tablet Take 20 mg by mouth daily.     Marland Kitchen escitalopram (LEXAPRO) 10 MG tablet Take 10 mg by mouth daily.    Marland Kitchen  glucosamine-chondroitin 500-400 MG tablet Take 1 tablet by mouth 3 (three) times daily.     No current facility-administered medications on file prior to visit.    Allergies:  No Known Allergies  Medical History:  has ADD (attention deficit disorder); Hyperlipidemia LDL goal <130; OA (osteoarthritis) of knee; and S/P knee replacement on his problem list.   Review of Systems:  Review of Systems  Constitutional: Negative.   HENT: Negative.   Eyes: Negative.   Respiratory: Negative.   Cardiovascular: Negative.   Gastrointestinal: Negative.   Genitourinary: Negative.   Musculoskeletal: Negative.   Skin: Negative.   Neurological: Negative.   Endo/Heme/Allergies: Negative.   Psychiatric/Behavioral: Negative.     Physical Exam: Estimated body mass index is 28.89 kg/m as calculated from the following:   Height as of this encounter: 6\' 1"  (1.854 m).   Weight as of this encounter: 219 lb (99.3 kg). BP 130/82   Pulse 78   Temp 97.7 F (36.5 C)   Resp 16   Ht 6\' 1"  (1.854 m)   Wt 219 lb (99.3 kg)   SpO2 97%   BMI 28.89 kg/m  General Appearance: Well nourished, in no apparent distress.  Eyes: PERRLA, EOMs, conjunctiva no swelling or erythema, normal fundi and vessels.  Sinuses: No Frontal/maxillary tenderness  ENT/Mouth: Ext aud canals clear, normal light  reflex with TMs without erythema, bulging. Good dentition. No erythema, swelling, or exudate on post pharynx. Tonsils not swollen or erythematous. Hearing normal.  Neck: Supple, thyroid normal. No bruits  Respiratory: Respiratory effort normal, BS equal bilaterally without rales, rhonchi, wheezing or stridor.  Cardio: RRR without murmurs, rubs or gallops. Brisk peripheral pulses without edema.  Chest: symmetric, with normal excursions and percussion.  Abdomen: Soft, nontender, no guarding, rebound, hernias, masses, or organomegaly.  Lymphatics: Non tender without lymphadenopathy.  Musculoskeletal: Full ROM all peripheral  extremities,5/5 strength, and normal gait.  Skin: Warm, dry without rashes, lesions, ecchymosis. Neuro: Cranial nerves intact, reflexes equal bilaterally. Normal muscle tone, no cerebellar symptoms. Sensation intact.  Psych: Awake and oriented X 3, normal affect, Insight and Judgment appropriate.   Quentin Mulling 4:05 PM [facility]

## 2016-12-25 ENCOUNTER — Ambulatory Visit (INDEPENDENT_AMBULATORY_CARE_PROVIDER_SITE_OTHER): Payer: Managed Care, Other (non HMO) | Admitting: Physician Assistant

## 2016-12-25 ENCOUNTER — Encounter: Payer: Self-pay | Admitting: Physician Assistant

## 2016-12-25 VITALS — BP 130/82 | HR 78 | Temp 97.7°F | Resp 16 | Ht 73.0 in | Wt 219.0 lb

## 2016-12-25 DIAGNOSIS — Z79899 Other long term (current) drug therapy: Secondary | ICD-10-CM | POA: Diagnosis not present

## 2016-12-25 DIAGNOSIS — F900 Attention-deficit hyperactivity disorder, predominantly inattentive type: Secondary | ICD-10-CM

## 2016-12-25 DIAGNOSIS — E785 Hyperlipidemia, unspecified: Secondary | ICD-10-CM | POA: Diagnosis not present

## 2016-12-25 DIAGNOSIS — R61 Generalized hyperhidrosis: Secondary | ICD-10-CM

## 2016-12-25 NOTE — Patient Instructions (Signed)
Mucoid cyst on finger, look it up.   Monitor place on ear, can be from pressure at night, put vaseline or hydrocortisone on it if not better can freeze it off in the office

## 2016-12-26 LAB — CBC WITH DIFFERENTIAL/PLATELET
BASOS PCT: 0.3 %
Basophils Absolute: 18 cells/uL (ref 0–200)
Eosinophils Absolute: 130 cells/uL (ref 15–500)
Eosinophils Relative: 2.2 %
HEMATOCRIT: 40.7 % (ref 38.5–50.0)
Hemoglobin: 13.8 g/dL (ref 13.2–17.1)
LYMPHS ABS: 1528 {cells}/uL (ref 850–3900)
MCH: 31.6 pg (ref 27.0–33.0)
MCHC: 33.9 g/dL (ref 32.0–36.0)
MCV: 93.1 fL (ref 80.0–100.0)
MPV: 11.1 fL (ref 7.5–12.5)
Monocytes Relative: 7.6 %
Neutro Abs: 3776 cells/uL (ref 1500–7800)
Neutrophils Relative %: 64 %
Platelets: 237 10*3/uL (ref 140–400)
RBC: 4.37 10*6/uL (ref 4.20–5.80)
RDW: 12.3 % (ref 11.0–15.0)
Total Lymphocyte: 25.9 %
WBC: 5.9 10*3/uL (ref 3.8–10.8)
WBCMIX: 448 {cells}/uL (ref 200–950)

## 2016-12-26 LAB — HEPATIC FUNCTION PANEL
AG RATIO: 2 (calc) (ref 1.0–2.5)
ALKALINE PHOSPHATASE (APISO): 55 U/L (ref 40–115)
ALT: 25 U/L (ref 9–46)
AST: 19 U/L (ref 10–35)
Albumin: 4.4 g/dL (ref 3.6–5.1)
Bilirubin, Direct: 0.2 mg/dL (ref 0.0–0.2)
GLOBULIN: 2.2 g/dL (ref 1.9–3.7)
Indirect Bilirubin: 1.1 mg/dL (calc) (ref 0.2–1.2)
TOTAL PROTEIN: 6.6 g/dL (ref 6.1–8.1)
Total Bilirubin: 1.3 mg/dL — ABNORMAL HIGH (ref 0.2–1.2)

## 2016-12-26 LAB — BASIC METABOLIC PANEL WITH GFR
BUN: 16 mg/dL (ref 7–25)
CHLORIDE: 106 mmol/L (ref 98–110)
CO2: 28 mmol/L (ref 20–32)
CREATININE: 1.18 mg/dL (ref 0.70–1.33)
Calcium: 9.2 mg/dL (ref 8.6–10.3)
GFR, Est African American: 78 mL/min/{1.73_m2} (ref >=60)
GFR, Est Non African American: 68 mL/min/{1.73_m2} (ref >=60)
GLUCOSE: 94 mg/dL (ref 65–99)
Potassium: 4.2 mmol/L (ref 3.5–5.3)
SODIUM: 141 mmol/L (ref 135–146)

## 2016-12-26 LAB — LIPID PANEL
Cholesterol: 189 mg/dL (ref <200)
HDL: 45 mg/dL (ref >40)
LDL Cholesterol (Calc): 118 mg/dL (calc) — ABNORMAL HIGH
Non-HDL Cholesterol (Calc): 144 mg/dL (calc) — ABNORMAL HIGH (ref <130)
TRIGLYCERIDES: 146 mg/dL (ref <150)
Total CHOL/HDL Ratio: 4.2 (calc) (ref <5.0)

## 2016-12-26 LAB — MAGNESIUM: MAGNESIUM: 2.1 mg/dL (ref 1.5–2.5)

## 2016-12-26 LAB — TSH: TSH: 1.91 m[IU]/L (ref 0.40–4.50)

## 2016-12-26 LAB — TESTOSTERONE: TESTOSTERONE: 416 ng/dL (ref 250–827)

## 2017-02-03 ENCOUNTER — Encounter: Payer: Self-pay | Admitting: Physician Assistant

## 2017-05-15 ENCOUNTER — Other Ambulatory Visit: Payer: Self-pay | Admitting: Orthopedic Surgery

## 2017-05-15 ENCOUNTER — Encounter (HOSPITAL_BASED_OUTPATIENT_CLINIC_OR_DEPARTMENT_OTHER): Payer: Self-pay | Admitting: *Deleted

## 2017-05-16 ENCOUNTER — Ambulatory Visit (HOSPITAL_BASED_OUTPATIENT_CLINIC_OR_DEPARTMENT_OTHER)
Admission: RE | Admit: 2017-05-16 | Discharge: 2017-05-16 | Disposition: A | Discharge status: Home / Self Care | Payer: Managed Care, Other (non HMO) | Source: Ambulatory Visit | Attending: Orthopedic Surgery | Admitting: Orthopedic Surgery

## 2017-05-16 ENCOUNTER — Encounter (HOSPITAL_BASED_OUTPATIENT_CLINIC_OR_DEPARTMENT_OTHER): Payer: Self-pay | Admitting: Anesthesiology

## 2017-05-16 ENCOUNTER — Other Ambulatory Visit: Payer: Self-pay

## 2017-05-16 ENCOUNTER — Encounter (HOSPITAL_BASED_OUTPATIENT_CLINIC_OR_DEPARTMENT_OTHER)
Admission: RE | Disposition: A | Discharge status: Home / Self Care | Payer: Self-pay | Source: Ambulatory Visit | Attending: Orthopedic Surgery

## 2017-05-16 ENCOUNTER — Ambulatory Visit (HOSPITAL_BASED_OUTPATIENT_CLINIC_OR_DEPARTMENT_OTHER): Payer: Managed Care, Other (non HMO) | Admitting: Anesthesiology

## 2017-05-16 DIAGNOSIS — E785 Hyperlipidemia, unspecified: Secondary | ICD-10-CM | POA: Diagnosis not present

## 2017-05-16 DIAGNOSIS — Z87891 Personal history of nicotine dependence: Secondary | ICD-10-CM | POA: Insufficient documentation

## 2017-05-16 DIAGNOSIS — M67441 Ganglion, right hand: Secondary | ICD-10-CM | POA: Diagnosis present

## 2017-05-16 DIAGNOSIS — Z96651 Presence of right artificial knee joint: Secondary | ICD-10-CM | POA: Insufficient documentation

## 2017-05-16 DIAGNOSIS — M19041 Primary osteoarthritis, right hand: Secondary | ICD-10-CM | POA: Insufficient documentation

## 2017-05-16 DIAGNOSIS — Z8249 Family history of ischemic heart disease and other diseases of the circulatory system: Secondary | ICD-10-CM | POA: Insufficient documentation

## 2017-05-16 HISTORY — PX: I & D EXTREMITY: SHX5045

## 2017-05-16 HISTORY — PX: MASS EXCISION: SHX2000

## 2017-05-16 SURGERY — EXCISION MASS
Anesthesia: Regional | Site: Finger | Laterality: Right

## 2017-05-16 MED ORDER — MIDAZOLAM HCL 2 MG/2ML IJ SOLN
INTRAMUSCULAR | Status: AC
  Filled 2017-05-16: qty 2

## 2017-05-16 MED ORDER — LIDOCAINE 2% (20 MG/ML) 5 ML SYRINGE
INTRAMUSCULAR | Status: AC
  Filled 2017-05-16: qty 5

## 2017-05-16 MED ORDER — PROPOFOL 10 MG/ML IV BOLUS
INTRAVENOUS | Status: DC | PRN
  Administered 2017-05-16 (×2): 10 mg via INTRAVENOUS
  Administered 2017-05-16: 20 mg via INTRAVENOUS

## 2017-05-16 MED ORDER — ACETAMINOPHEN 325 MG PO TABS: 325.0000 mg | ORAL_TABLET | ORAL | Status: DC | PRN

## 2017-05-16 MED ORDER — KETOROLAC TROMETHAMINE 30 MG/ML IJ SOLN
INTRAMUSCULAR | Status: AC
  Filled 2017-05-16: qty 1

## 2017-05-16 MED ORDER — ONDANSETRON HCL 4 MG/2ML IJ SOLN
INTRAMUSCULAR | Status: AC
  Filled 2017-05-16: qty 2

## 2017-05-16 MED ORDER — KETOROLAC TROMETHAMINE 30 MG/ML IJ SOLN: 30.0000 mg | Freq: Once | INTRAMUSCULAR | Status: DC | PRN

## 2017-05-16 MED ORDER — HYDROCODONE-ACETAMINOPHEN 5-325 MG PO TABS: 1.0000 | ORAL_TABLET | Freq: Four times a day (QID) | ORAL | 0 refills | Status: DC | PRN

## 2017-05-16 MED ORDER — LIDOCAINE HCL (PF) 0.5 % IJ SOLN
INTRAMUSCULAR | Status: DC | PRN
  Administered 2017-05-16: 35 mL via INTRAVENOUS

## 2017-05-16 MED ORDER — DEXAMETHASONE SODIUM PHOSPHATE 10 MG/ML IJ SOLN
INTRAMUSCULAR | Status: AC
  Filled 2017-05-16: qty 1

## 2017-05-16 MED ORDER — OXYCODONE HCL 5 MG/5ML PO SOLN: 5.0000 mg | Freq: Once | ORAL | Status: DC | PRN

## 2017-05-16 MED ORDER — ONDANSETRON HCL 4 MG/2ML IJ SOLN
INTRAMUSCULAR | Status: DC | PRN
Start: 2017-05-16 — End: 2017-05-16
  Administered 2017-05-16: 4 mg via INTRAVENOUS

## 2017-05-16 MED ORDER — PROPOFOL 10 MG/ML IV BOLUS
INTRAVENOUS | Status: AC
  Filled 2017-05-16: qty 20

## 2017-05-16 MED ORDER — ONDANSETRON HCL 4 MG/2ML IJ SOLN: 4.0000 mg | Freq: Once | INTRAMUSCULAR | Status: DC | PRN

## 2017-05-16 MED ORDER — BUPIVACAINE HCL (PF) 0.25 % IJ SOLN
INTRAMUSCULAR | Status: AC
  Filled 2017-05-16: qty 30

## 2017-05-16 MED ORDER — CEFAZOLIN SODIUM-DEXTROSE 2-4 GM/100ML-% IV SOLN
2.0000 g | INTRAVENOUS | Status: AC
  Administered 2017-05-16: 2 g via INTRAVENOUS

## 2017-05-16 MED ORDER — CHLORHEXIDINE GLUCONATE 4 % EX LIQD: 60.0000 mL | Freq: Once | CUTANEOUS | Status: DC

## 2017-05-16 MED ORDER — MEPERIDINE HCL 25 MG/ML IJ SOLN: 6.2500 mg | INTRAMUSCULAR | Status: DC | PRN

## 2017-05-16 MED ORDER — FENTANYL CITRATE (PF) 100 MCG/2ML IJ SOLN
50.0000 ug | INTRAMUSCULAR | Status: DC | PRN
  Administered 2017-05-16: 100 ug via INTRAVENOUS

## 2017-05-16 MED ORDER — BUPIVACAINE HCL (PF) 0.25 % IJ SOLN
INTRAMUSCULAR | Status: DC | PRN
  Administered 2017-05-16: 10 mL

## 2017-05-16 MED ORDER — DEXAMETHASONE SODIUM PHOSPHATE 10 MG/ML IJ SOLN
INTRAMUSCULAR | Status: DC | PRN
  Administered 2017-05-16: 10 mg via INTRAVENOUS

## 2017-05-16 MED ORDER — KETOROLAC TROMETHAMINE 30 MG/ML IJ SOLN
INTRAMUSCULAR | Status: DC | PRN
  Administered 2017-05-16: 30 mg via INTRAVENOUS

## 2017-05-16 MED ORDER — OXYCODONE HCL 5 MG PO TABS: 5.0000 mg | ORAL_TABLET | Freq: Once | ORAL | Status: DC | PRN

## 2017-05-16 MED ORDER — FENTANYL CITRATE (PF) 100 MCG/2ML IJ SOLN
INTRAMUSCULAR | Status: AC
  Filled 2017-05-16: qty 2

## 2017-05-16 MED ORDER — SCOPOLAMINE 1 MG/3DAYS TD PT72: 1.0000 | MEDICATED_PATCH | Freq: Once | TRANSDERMAL | Status: DC | PRN

## 2017-05-16 MED ORDER — ACETAMINOPHEN 160 MG/5ML PO SOLN: 325.0000 mg | ORAL | Status: DC | PRN

## 2017-05-16 MED ORDER — CEFAZOLIN SODIUM-DEXTROSE 2-4 GM/100ML-% IV SOLN
INTRAVENOUS | Status: AC
  Filled 2017-05-16: qty 100

## 2017-05-16 MED ORDER — MIDAZOLAM HCL 2 MG/2ML IJ SOLN
1.0000 mg | INTRAMUSCULAR | Status: DC | PRN
  Administered 2017-05-16: 2 mg via INTRAVENOUS

## 2017-05-16 MED ORDER — FENTANYL CITRATE (PF) 100 MCG/2ML IJ SOLN: 25.0000 ug | INTRAMUSCULAR | Status: DC | PRN

## 2017-05-16 MED ORDER — LACTATED RINGERS IV SOLN
INTRAVENOUS | Status: DC
  Administered 2017-05-16: 13:00:00 via INTRAVENOUS

## 2017-05-16 SURGICAL SUPPLY — 57 items
BAG DECANTER FOR FLEXI CONT (MISCELLANEOUS) IMPLANT
BANDAGE COBAN STERILE 2 (GAUZE/BANDAGES/DRESSINGS) IMPLANT
BLADE MINI RND TIP GREEN BEAV (BLADE) IMPLANT
BLADE SURG 15 STRL LF DISP TIS (BLADE) ×1 IMPLANT
BLADE SURG 15 STRL SS (BLADE) ×3
BNDG CMPR 9X4 STRL LF SNTH (GAUZE/BANDAGES/DRESSINGS)
BNDG COHESIVE 1X5 TAN STRL LF (GAUZE/BANDAGES/DRESSINGS) ×2 IMPLANT
BNDG COHESIVE 3X5 TAN STRL LF (GAUZE/BANDAGES/DRESSINGS) IMPLANT
BNDG ESMARK 4X9 LF (GAUZE/BANDAGES/DRESSINGS) IMPLANT
BNDG GAUZE ELAST 4 BULKY (GAUZE/BANDAGES/DRESSINGS) IMPLANT
CHLORAPREP W/TINT 26ML (MISCELLANEOUS) ×3 IMPLANT
CORD BIPOLAR FORCEPS 12FT (ELECTRODE) ×3 IMPLANT
COVER BACK TABLE 60X90IN (DRAPES) ×3 IMPLANT
COVER MAYO STAND STRL (DRAPES) ×3 IMPLANT
CUFF TOURNIQUET SINGLE 18IN (TOURNIQUET CUFF) ×2 IMPLANT
DECANTER SPIKE VIAL GLASS SM (MISCELLANEOUS) IMPLANT
DRAIN PENROSE 1/2X12 LTX STRL (WOUND CARE) IMPLANT
DRAPE EXTREMITY T 121X128X90 (DRAPE) ×3 IMPLANT
DRAPE SURG 17X23 STRL (DRAPES) ×3 IMPLANT
GAUZE PACKING IODOFORM 1/4X15 (GAUZE/BANDAGES/DRESSINGS) IMPLANT
GAUZE SPONGE 4X4 12PLY STRL (GAUZE/BANDAGES/DRESSINGS) ×3 IMPLANT
GAUZE XEROFORM 1X8 LF (GAUZE/BANDAGES/DRESSINGS) ×3 IMPLANT
GLOVE BIOGEL PI IND STRL 7.0 (GLOVE) IMPLANT
GLOVE BIOGEL PI IND STRL 8.5 (GLOVE) ×1 IMPLANT
GLOVE BIOGEL PI INDICATOR 7.0 (GLOVE) ×2
GLOVE BIOGEL PI INDICATOR 8.5 (GLOVE) ×2
GLOVE ECLIPSE 6.5 STRL STRAW (GLOVE) ×2 IMPLANT
GLOVE EXAM NITRILE MD LF STRL (GLOVE) ×2 IMPLANT
GLOVE SURG ORTHO 8.0 STRL STRW (GLOVE) ×3 IMPLANT
GOWN STRL REUS W/ TWL LRG LVL3 (GOWN DISPOSABLE) ×1 IMPLANT
GOWN STRL REUS W/ TWL XL LVL3 (GOWN DISPOSABLE) IMPLANT
GOWN STRL REUS W/TWL LRG LVL3 (GOWN DISPOSABLE)
GOWN STRL REUS W/TWL XL LVL3 (GOWN DISPOSABLE) ×6 IMPLANT
LOOP VESSEL MAXI BLUE (MISCELLANEOUS) IMPLANT
NDL PRECISIONGLIDE 27X1.5 (NEEDLE) ×1 IMPLANT
NDL SAFETY ECLIPSE 18X1.5 (NEEDLE) IMPLANT
NEEDLE HYPO 18GX1.5 SHARP (NEEDLE)
NEEDLE PRECISIONGLIDE 27X1.5 (NEEDLE) ×3 IMPLANT
NS IRRIG 1000ML POUR BTL (IV SOLUTION) ×3 IMPLANT
PACK BASIN DAY SURGERY FS (CUSTOM PROCEDURE TRAY) ×3 IMPLANT
PAD CAST 3X4 CTTN HI CHSV (CAST SUPPLIES) IMPLANT
PADDING CAST ABS 4INX4YD NS (CAST SUPPLIES)
PADDING CAST ABS COTTON 4X4 ST (CAST SUPPLIES) ×1 IMPLANT
PADDING CAST COTTON 3X4 STRL (CAST SUPPLIES)
SPLINT FINGER 3.25 BULB 911905 (SOFTGOODS) ×2 IMPLANT
SPLINT PLASTER CAST XFAST 3X15 (CAST SUPPLIES) IMPLANT
SPLINT PLASTER XTRA FASTSET 3X (CAST SUPPLIES)
STOCKINETTE 4X48 STRL (DRAPES) ×3 IMPLANT
SUT ETHILON 4 0 PS 2 18 (SUTURE) ×3 IMPLANT
SUT VIC AB 4-0 P2 18 (SUTURE) IMPLANT
SWAB COLLECTION DEVICE MRSA (MISCELLANEOUS) IMPLANT
SWAB CULTURE ESWAB REG 1ML (MISCELLANEOUS) IMPLANT
SYR BULB 3OZ (MISCELLANEOUS) ×3 IMPLANT
SYR CONTROL 10ML LL (SYRINGE) ×3 IMPLANT
TOWEL OR 17X24 6PK STRL BLUE (TOWEL DISPOSABLE) ×6 IMPLANT
TUBE FEEDING ENTERAL 5FR 16IN (TUBING) IMPLANT
UNDERPAD 30X30 (UNDERPADS AND DIAPERS) ×3 IMPLANT

## 2017-05-16 NOTE — Transfer of Care (Signed)
Immediate Anesthesia Transfer of Care Note  Patient: Dean Cannon  Procedure(s) Performed: EXCISION MUCOID CYST RIGHT RING FINGER (Right Finger) DEBRIDEMENT OF DISTAL INTERPHALANGEAL JOINT OF RIGHT RING FINGER (Right Finger)  Patient Location: PACU  Anesthesia Type:MAC  Level of Consciousness: awake, alert  and oriented  Airway & Oxygen Therapy: Patient Spontanous Breathing  Post-op Assessment: Report given to RN and Post -op Vital signs reviewed and stable  Post vital signs: Reviewed and stable  Last Vitals:  Vitals:   05/16/17 1240  BP: 128/80  Pulse: (!) 56  Resp: 20  Temp: 36.6 C  SpO2: 100%    Last Pain:  Vitals:   05/16/17 1240  TempSrc: Oral         Complications: No apparent anesthesia complications

## 2017-05-16 NOTE — Anesthesia Procedure Notes (Signed)
Anesthesia Regional Block: Bier block (IV Regional)   Pre-Anesthetic Checklist: ,, timeout performed, Correct Patient, Correct Site, Correct Laterality, Correct Procedure,, site marked, surgical consent,, at surgeon's request Needles:  Injection technique: Single-shot  Needle Type: Other      Needle Gauge: 20     Additional Needles:   Procedures:,,,,, intact distal pulses, Esmarch exsanguination, single tourniquet utilized,  Narrative:   Performed by: Personally       

## 2017-05-16 NOTE — Anesthesia Procedure Notes (Signed)
Procedure Name: MAC Performed by: Bobbijo Holst W, CRNA Pre-anesthesia Checklist: Patient identified, Timeout performed, Emergency Drugs available, Suction available and Patient being monitored Patient Re-evaluated:Patient Re-evaluated prior to induction Oxygen Delivery Method: Simple face mask       

## 2017-05-16 NOTE — Anesthesia Preprocedure Evaluation (Addendum)
Anesthesia Evaluation  Patient identified by MRN, date of birth, ID band Patient awake    Reviewed: Allergy & Precautions, NPO status , Patient's Chart, lab work & pertinent test results  Airway Mallampati: II  TM Distance: >3 FB Neck ROM: Full    Dental no notable dental hx.    Pulmonary former smoker,    Pulmonary exam normal breath sounds clear to auscultation       Cardiovascular negative cardio ROS Normal cardiovascular exam Rhythm:Regular Rate:Normal     Neuro/Psych negative neurological ROS  negative psych ROS   GI/Hepatic negative GI ROS, Neg liver ROS,   Endo/Other  negative endocrine ROS  Renal/GU negative Renal ROS  negative genitourinary   Musculoskeletal  (+) Arthritis ,   Abdominal   Peds negative pediatric ROS (+)  Hematology negative hematology ROS (+)   Anesthesia Other Findings   Reproductive/Obstetrics negative OB ROS                             Anesthesia Physical  Anesthesia Plan  ASA: II  Anesthesia Plan: Bier Block and Bier Block-LIDOCAINE ONLY   Post-op Pain Management:    Induction: Intravenous  PONV Risk Score and Plan:   Airway Management Planned: Natural Airway and Nasal Cannula  Additional Equipment:   Intra-op Plan:   Post-operative Plan:   Informed Consent: I have reviewed the patients History and Physical, chart, labs and discussed the procedure including the risks, benefits and alternatives for the proposed anesthesia with the patient or authorized representative who has indicated his/her understanding and acceptance.   Dental advisory given  Plan Discussed with: CRNA  Anesthesia Plan Comments:        Anesthesia Quick Evaluation

## 2017-05-16 NOTE — Op Note (Signed)
Other Dictation: Dictation Number [id number]

## 2017-05-16 NOTE — Brief Op Note (Signed)
05/16/2017  2:04 PM  PATIENT:  Dean Cannon  62 y.o. male  PRE-OPERATIVE DIAGNOSIS:  MUCOID CYST RIGHT RING FINGER  POST-OPERATIVE DIAGNOSIS:  MUCOID CYST RIGHT RING FINGER  PROCEDURE:  Procedure(s): EXCISION MUCOID CYST RIGHT RING FINGER (Right) DEBRIDEMENT OF DISTAL INTERPHALANGEAL JOINT OF RIGHT RING FINGER (Right)  SURGEON:  Surgeon(s) and Role:    Cindee Salt* Sritha Chauncey, MD - Primary  PHYSICIAN ASSISTANT:   ASSISTANTS: none   ANESTHESIA:   local, regional and IV sedation  EBL:  1 mL   BLOOD ADMINISTERED:none  DRAINS: none   LOCAL MEDICATIONS USED:  BUPIVICAINE   SPECIMEN:  Excision  DISPOSITION OF SPECIMEN:  PATHOLOGY  COUNTS:  YES  TOURNIQUET:   Total Tourniquet Time Documented: Forearm (Right) - 28 minutes Total: Forearm (Right) - 28 minutes   DICTATION: .Other Dictation: Dictation Number [id number]  PLAN OF CARE: Discharge to home after PACU  PATIENT DISPOSITION:  PACU - hemodynamically stable.

## 2017-05-16 NOTE — Discharge Instructions (Signed)

## 2017-05-16 NOTE — Anesthesia Postprocedure Evaluation (Signed)
Anesthesia Post Note  Patient: Dean Cannon  Procedure(s) Performed: EXCISION MUCOID CYST RIGHT RING FINGER (Right Finger) DEBRIDEMENT OF DISTAL INTERPHALANGEAL JOINT OF RIGHT RING FINGER (Right Finger)     Patient location during evaluation: PACU Anesthesia Type: Bier Block and MAC Level of consciousness: awake and alert Pain management: pain level controlled Vital Signs Assessment: post-procedure vital signs reviewed and stable Respiratory status: spontaneous breathing, nonlabored ventilation, respiratory function stable and patient connected to nasal cannula oxygen Cardiovascular status: stable and blood pressure returned to baseline Postop Assessment: no apparent nausea or vomiting Anesthetic complications: no    Last Vitals:  Vitals:   05/16/17 1409 05/16/17 1429  BP:    Pulse: (!) 52 (!) 52  Resp: 12 16  Temp:  36.9 C  SpO2: 95% 100%    Last Pain:  Vitals:   05/16/17 1354  TempSrc:   PainSc: 0-No pain                 Aeric Burnham

## 2017-05-16 NOTE — H&P (Signed)
Dean BenceJohn M Mozley is an 62 y.o. male.   Chief Complaint: mass right ring finger HPI: Dean RuizJohn is a 62 year old ambidextrous male referred by Dr. Benedetto GoadFred Wilson. He has had a mass in the dorsal aspect right ring finger for 2 years. He states it last opened approximately a month ago. He states he puts the topical antibiotic on it when it does it drains a clear thick fluid. He recalls no history of injury. Complains of a burning pain just before it opens with a VAS score of 7/10. He has not tried any medicine for this. He has a history of arthritis. He has no history of diabetes thyroid problems or gout. Family history is positive arthritis negative for diabetes thyroid problems and gout. History reviewed. No pertinent past medical history.      Past Medical History:  Diagnosis Date  . ADD (attention deficit disorder)   . Arthritis   . Hyperlipidemia LDL goal <130 12/13/2015    Past Surgical History:  Procedure Laterality Date  . KNEE SURGERY Right 2004  . right shoulder surgery     2000  . TOTAL KNEE ARTHROPLASTY Right 01/02/2016   Procedure: RIGHT TOTAL KNEE ARTHROPLASTY;  Surgeon: Eugenia Mcalpineobert Collins, MD;  Location: [facility];  Service: Orthopedics;  Laterality: Right;    Family History  Problem Relation Age of Onset  . Cancer Mother        Lung Cancer  . Cancer Father        Blood dyscrasia  . Heart disease Father 970       Heart attack   . Diabetes Brother   . Cancer Maternal Grandmother        GI cancer  . Cancer Maternal Grandfather   . Cancer Paternal Grandmother   . Cancer Paternal Grandfather    Social History:  reports that he quit smoking about 39 years ago. His smoking use included cigarettes. He has a 1.50 pack-year smoking history. He has quit using smokeless tobacco. His smokeless tobacco use included chew. He reports that he drinks about 7.2 oz of alcohol per week. He reports that he does not use drugs.  Allergies: No Known Allergies  No medications prior to admission.    No  results found for this or any previous visit (from the past 48 hour(s)).  No results found.   Pertinent items are noted in HPI.  Height 6\' 1"  (1.854 m), weight 99.3 kg (219 lb).  General appearance: alert, cooperative and appears stated age Head: Normocephalic, without obvious abnormality Neck: no JVD Resp: clear to auscultation bilaterally Cardio: regular rate and rhythm, S1, S2 normal, no murmur, click, rub or gallop GI: soft, non-tender; bowel sounds normal; no masses,  no organomegaly Extremities: mass right ring finger Pulses: 2+ and symmetric Skin: Skin color, texture, turgor normal. No rashes or lesions Neurologic: Grossly normal Incision/Wound: na  Assessment/Plan Assessment:   Osteoarthritis of finger of right hand   Mucoid cyst, joint    Plan: We have discussed the etiology of the problem with him we have discussed various treatment alternatives including excision of the cyst debridement of the joint. He would like to proceed to have that done. Pre-peri-and postoperative course are discussed along with risk complications. He is aware that there is no guarantee to the surgery the possibility of infection recurrence injury to arteries nerves tendons incomplete relief symptoms dystrophy he is aware of the arthritis being the underlying cause of the problem and the only sure way of getting rid of the cyst  and not returned is fusion of the joint. He would like to proceed he is scheduled for excision cyst debridement distal phalangeal joint right ring finger as an outpatient under regional anesthesia.       Edman Lipsey R 05/16/2017, 10:08 AM

## 2017-05-17 NOTE — Op Note (Signed)
NAMEMUREL, SHENBERGER NO.:  [id number]  MEDICAL RECORD NO.:  [id number]  LOCATION:                                 FACILITY:  PHYSICIAN:  Cindee Salt, M.D.            DATE OF BIRTH:  DATE OF PROCEDURE:  05/16/2017 DATE OF DISCHARGE:                              OPERATIVE REPORT   PREOPERATIVE DIAGNOSIS:  Mucoid cyst with degenerative arthritis, right ring finger distal interphalangeal joint.  POSTOPERATIVE DIAGNOSIS:  Mucoid cyst with degenerative arthritis, right ring finger distal interphalangeal joint.  OPERATIONS:  Excision of mucoid cyst and debridement of distal interphalangeal joint osteophytes, middle phalanx, right ring finger.  SURGEON:  Cindee Salt, MD.  ASSISTANT:  None.  ANESTHESIA:  Forearm IV regional with sedation and metacarpal block.  PLACE OF SURGERY:  [facility].  ANESTHESIOLOGIST:  [place].  HISTORY:  The patient is a 62 year old male with a history of a mass on the distal phalanx of his right ring finger.  Arthritis is noted on x- ray.  He has elected to have this surgically excised with debridement of the joint.  Pre, peri, and postoperative course have been discussed along with risks and complications.  He is aware that there is no guarantee to the surgery, the possibility of infection, recurrence of injury to arteries, nerves, and tendons, incomplete relief of symptoms, and dystrophy.  In the preoperative area, the patient is seen, the extremity marked by both the patient and surgeon, antibiotic given.  DESCRIPTION OF PROCEDURE:  The patient was brought to the operating room where a forearm-based IV regional anesthetic was carried out without difficulty under the direction of the Anesthesia Department.  He was prepped using ChloraPrep in supine position with the right arm free.  A 3-minute dry time was allowed and time-out was taken confirming the patient and procedure.  A metacarpal block was given using  0.25% bupivacaine without epinephrine, 9 mL was used.  Curvilinear incision was made over the distal interphalangeal joint of the right ring finger. This was carried down through subcutaneous tissue.  Bleeders were electrocauterized with bipolar as necessary.  The dissection was then carried beneath the skin distally.  The cyst was isolated with a hemostatic rongeur and a House curette.  The cyst was then ruptured and the most volar aspect removed maintaining the skin intact.  The specimen was sent to Pathology.  The joint was then opened on its ulnar border. The extensor tendon was retracted and the hemostatic rongeur was used to do a synovectomy of the dorsal aspect of the joint and remove osteophytes from the middle phalanx.  Specimen was sent to Pathology. The wound was copiously irrigated with saline.  The skin was closed with interrupted 4-0 nylon sutures.  A sterile compressive dressing and splint to the finger was applied.  On deflation of the tourniquet, remaining fingers pinked.  He was taken to the recovery room for observation in satisfactory condition.  He will be discharged to home to return to [facility] in 1 week, on [facility].          ______________________________ Cindee Salt, M.D.  GK/MEDQ  D:  05/16/2017  T:  05/17/2017  Job:  [id number]

## 2017-05-19 ENCOUNTER — Encounter (HOSPITAL_BASED_OUTPATIENT_CLINIC_OR_DEPARTMENT_OTHER): Payer: Self-pay | Admitting: Orthopedic Surgery

## 2017-06-26 ENCOUNTER — Ambulatory Visit: Payer: Self-pay | Admitting: Physician Assistant

## 2017-11-17 IMAGING — CR DG FINGER RING 2+V*R*
3 series · 3 of 3 positions shown · non-contrast
Comparison: None

CLINICAL DATA: Recurring paronychia RIGHT ring finger for 1.0-1.5
years question foreign body, injury to finger of RIGHT hand initial
encounter

EXAM:
RIGHT RING FINGER 2+V

[finger ap]
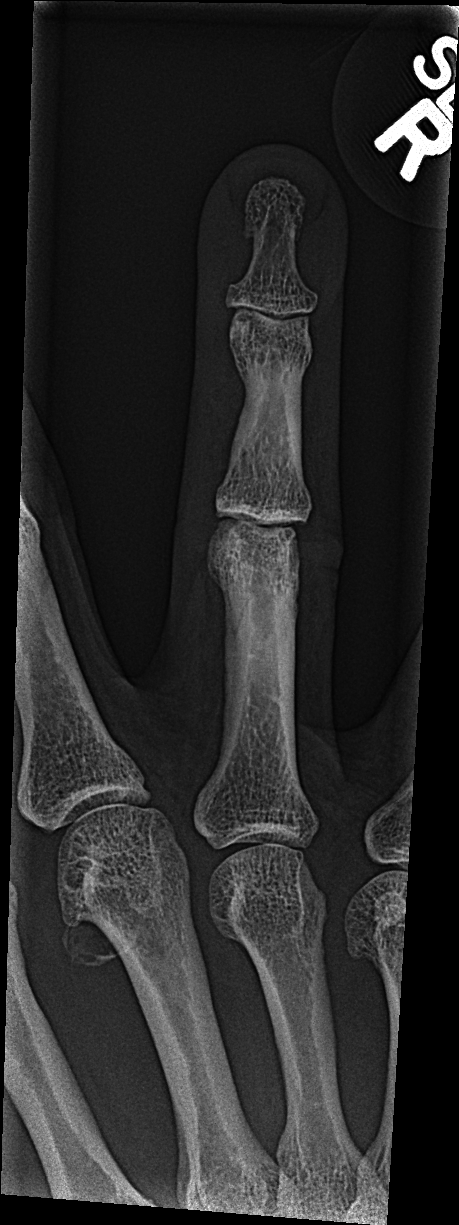

[finger obl]
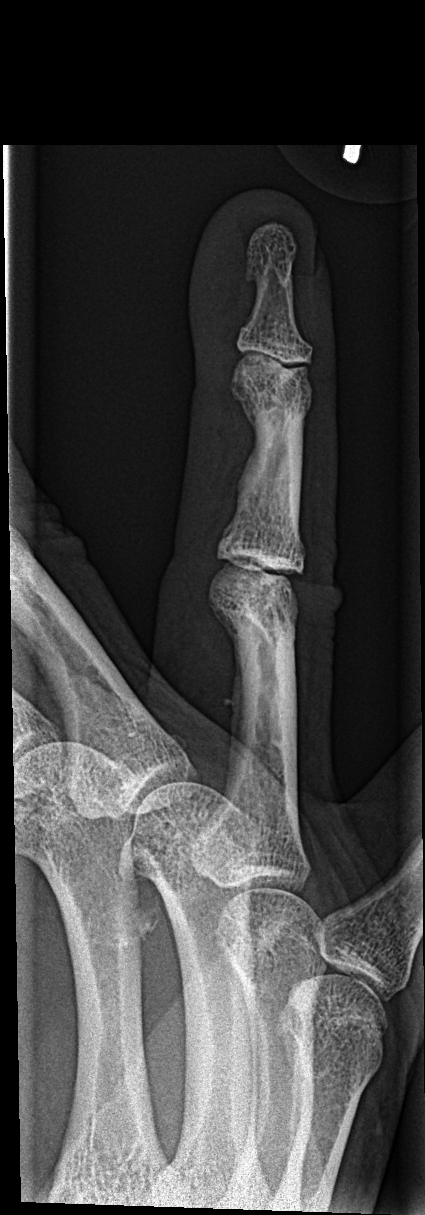

[finger lat]
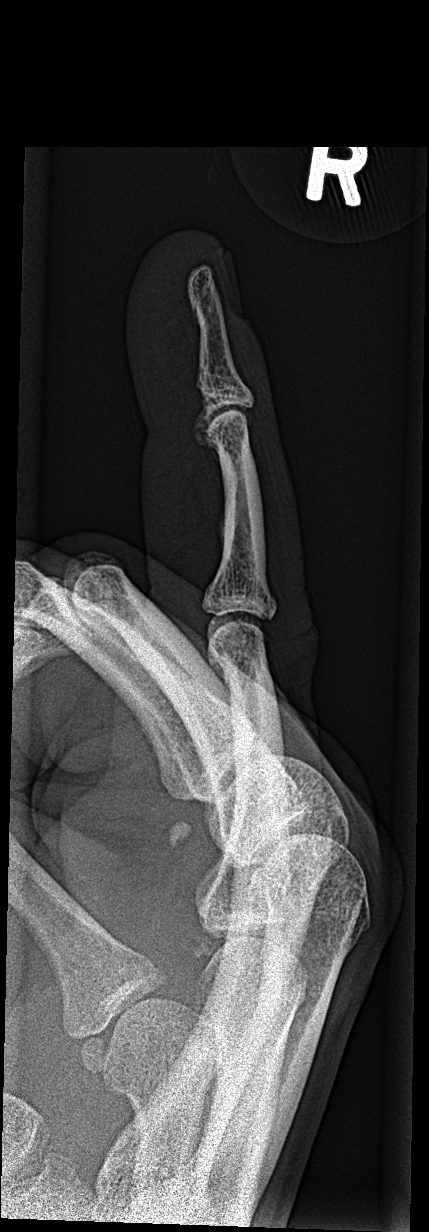

[3 of 3 positions shown; findings below may reference images not displayed]

FINDINGS: Osseous mineralization normal.

Joint spaces preserved.

No fracture, dislocation, or bone destruction.

No radiopaque foreign body identified.
IMPRESSION: Normal exam.

## 2017-12-10 ENCOUNTER — Ambulatory Visit: Payer: Self-pay | Admitting: Orthopedic Surgery

## 2017-12-29 NOTE — Patient Instructions (Addendum)
Dean BenceJohn M Cannon  12/29/2017   Your procedure is scheduled on: 01-09-18   [facility]  Entr[facility]    Report to admitting at 7:30AM     Call this number if you have problems the morning of surgery [phone]     Remember: Do not eat food or drink liquids :After Midnight.     Take these medicines the morning of surgery with A SIP OF WATER: escitalopram (lexapro), clairitin                                 You may not have any metal on your body including hair pins and              piercings  Do not wear jewelry, make-up, lotions, powders or perfumes, deodorant                    Men may shave face and neck.   Do not bring valuables to the hospital. [facility] IS NOT             RESPONSIBLE   FOR VALUABLES.  Contacts, dentures or bridgework may not be worn into surgery.  Leave suitcase in the car. After surgery it may be brought to your room.                   Please read over the following fact sheets you were given: _____________________________________________________________________             [facility] - Preparing for [facility] Before surgery, you can play an important role.  Because skin is not sterile, your skin needs to be as free of germs as possible.  You can reduce the number of germs on your skin by washing with CHG (chlorahexidine gluconate) soap before surgery.  CHG is an antiseptic cleaner which kills germs and bonds with the skin to continue killing germs even after washing. Please DO NOT use if you have an allergy to CHG or antibacterial soaps.  If your skin becomes reddened/irritated stop using the CHG and inform your nurse when you arrive at Short Stay. Do not shave (including legs and underarms) for at least 48 hours prior to the first CHG shower.  You may shave your face/neck. Please follow these instructions carefully:  1.  Shower with CHG Soap the night before surgery and the  morning of Surgery.  2.  If you choose to  wash your hair, wash your hair first as usual with your  normal  shampoo.  3.  After you shampoo, rinse your hair and body thoroughly to remove the  shampoo.                           4.  Use CHG as you would any other liquid soap.  You can apply chg directly  to the skin and wash                       Gently with a scrungie or clean washcloth.  5.  Apply the CHG Soap to your body ONLY FROM THE NECK DOWN.   Do not use on face/ open  Wound or open sores. Avoid contact with eyes, ears mouth and genitals (private parts).                       Wash face,  Genitals (private parts) with your normal soap.             6.  Wash thoroughly, paying special attention to the area where your surgery  will be performed.  7.  Thoroughly rinse your body with warm water from the neck down.  8.  DO NOT shower/wash with your normal soap after using and rinsing off  the CHG Soap.                9.  Pat yourself dry with a clean towel.            10.  Wear clean pajamas.            11.  Place clean sheets on your bed the night of your first shower and do not  sleep with pets. Day of Surgery : Do not apply any lotions/deodorants the morning of surgery.  Please wear clean clothes to the hospital/surgery center.  FAILURE TO FOLLOW THESE INSTRUCTIONS MAY RESULT IN THE CANCELLATION OF YOUR SURGERY PATIENT SIGNATURE_________________________________  NURSE SIGNATURE__________________________________  ________________________________________________________________________   Dean Cannon  An incentive spirometer is a tool that can help keep your lungs clear and active. This tool measures how well you are filling your lungs with each breath. Taking long deep breaths may help reverse or decrease the chance of developing breathing (pulmonary) problems (especially infection) following:  A long period of time when you are unable to move or be active. BEFORE THE PROCEDURE   If the  spirometer includes an indicator to show your best effort, your nurse or respiratory therapist will set it to a desired goal.  If possible, sit up straight or lean slightly forward. Try not to slouch.  Hold the incentive spirometer in an upright position. INSTRUCTIONS FOR USE  1. Sit on the edge of your bed if possible, or sit up as far as you can in bed or on a chair. 2. Hold the incentive spirometer in an upright position. 3. Breathe out normally. 4. Place the mouthpiece in your mouth and seal your lips tightly around it. 5. Breathe in slowly and as deeply as possible, raising the piston or the ball toward the top of the column. 6. Hold your breath for 3-5 seconds or for as long as possible. Allow the piston or ball to fall to the bottom of the column. 7. Remove the mouthpiece from your mouth and breathe out normally. 8. Rest for a few seconds and repeat Steps 1 through 7 at least 10 times every 1-2 hours when you are awake. Take your time and take a few normal breaths between deep breaths. 9. The spirometer may include an indicator to show your best effort. Use the indicator as a goal to work toward during each repetition. 10. After each set of 10 deep breaths, practice coughing to be sure your lungs are clear. If you have an incision (the cut made at the time of surgery), support your incision when coughing by placing a pillow or rolled up towels firmly against it. Once you are able to get out of bed, walk around indoors and cough well. You may stop using the incentive spirometer when instructed by your caregiver.  RISKS AND COMPLICATIONS  Take your time so you do not get  dizzy or light-headed.  If you are in pain, you may need to take or ask for pain medication before doing incentive spirometry. It is harder to take a deep breath if you are having pain. AFTER USE  Rest and breathe slowly and easily.  It can be helpful to keep track of a log of your progress. Your caregiver can provide  you with a simple table to help with this. If you are using the spirometer at home, follow these instructions: [place] IF:   You are having difficultly using the spirometer.  You have trouble using the spirometer as often as instructed.  Your pain medication is not giving enough relief while using the spirometer.  You develop fever of 100.5 F (38.1 C) or higher. SEEK IMMEDIATE MEDICAL CARE IF:   You cough up bloody sputum that had not been present before.  You develop fever of 102 F (38.9 C) or greater.  You develop worsening pain at or near the incision site. MAKE SURE YOU:   Understand these instructions.  Will watch your condition.  Will get help right away if you are not doing well or get worse. Document Released: 09/02/2006 Document Revised: 07/15/2011 Document Reviewed: 11/03/2006 [facility] Patient Information 2014 [place], [place].   ________________________________________________________________________

## 2017-12-29 NOTE — Progress Notes (Signed)
Surgical clearance Dr. Benedetto GoadFred Wilson 12-01-17 on chart

## 2017-12-29 NOTE — H&P (Signed)
TOTAL KNEE ADMISSION H&P  Patient is being admitted for left total knee arthroplasty.  Subjective:  Chief Complaint:left knee pain.  HPI: Dean BenceJohn M Cannon, 62 y.o. male, has a history of pain and functional disability in the left knee due to arthritis and has failed non-surgical conservative treatments for greater than 12 weeks to includecorticosteriod injections, viscosupplementation injections, supervised PT with diminished ADL's post treatment, use of assistive devices, weight reduction as appropriate and activity modification.  Onset of symptoms was gradual, starting 2 years ago with gradually worsening course since that time. The patient noted no past surgery on the left knee(s).  Patient currently rates pain in the left knee(s) at 7 out of 10 with activity. Patient has worsening of pain with activity and weight bearing, pain that interferes with activities of daily living, pain with passive range of motion and joint swelling.  Patient has evidence of subchondral sclerosis, periarticular osteophytes and joint space narrowing by imaging studies. There is no active infection.  Patient Active Problem List   Diagnosis Date Noted  . S/P knee replacement 01/02/2016  . Hyperlipidemia LDL goal <130 12/13/2015  . OA (osteoarthritis) of knee 12/13/2015  . ADD (attention deficit disorder) 12/12/2015   Past Medical History:  Diagnosis Date  . ADD (attention deficit disorder)   . Arthritis   . Hyperlipidemia LDL goal <130 12/13/2015    Past Surgical History:  Procedure Laterality Date  . I&D EXTREMITY Right 05/16/2017   Procedure: DEBRIDEMENT OF DISTAL INTERPHALANGEAL JOINT OF RIGHT RING FINGER;  Surgeon: Cindee SaltKuzma, Gary, MD;  Location: [facility];  Service: Orthopedics;  Laterality: Right;  . KNEE SURGERY Right 2004  . MASS EXCISION Right 05/16/2017   Procedure: EXCISION MUCOID CYST RIGHT RING FINGER;  Surgeon: Cindee SaltKuzma, Gary, MD;  Location: [facility];  Service: Orthopedics;   Laterality: Right;  . right shoulder surgery     2000  . TOTAL KNEE ARTHROPLASTY Right 01/02/2016   Procedure: RIGHT TOTAL KNEE ARTHROPLASTY;  Surgeon: Eugenia Mcalpineobert Collins, MD;  Location: [facility];  Service: Orthopedics;  Laterality: Right;    No current facility-administered medications for this encounter.    Current Outpatient Medications  Medication Sig Dispense Refill Last Dose  . amphetamine-dextroamphetamine (ADDERALL XR) 30 MG 24 hr capsule Take 60 mg by mouth daily.     Marland Kitchen. escitalopram (LEXAPRO) 10 MG tablet Take 10 mg by mouth daily.   05/16/2017 at 0730  . Glucosamine HCl 1500 MG TABS Take 1,500 mg by mouth daily.     Marland Kitchen. loratadine (CLARITIN) 10 MG tablet Take 10 mg by mouth daily as needed for allergies.     . naproxen sodium (ALEVE) 220 MG tablet Take 440 mg by mouth daily as needed (pain).      No Known Allergies  Social History   Tobacco Use  . Smoking status: Former Smoker    Packs/day: 0.50    Years: 3.00    Pack years: 1.50    Types: Cigarettes    Last attempt to quit: 05/06/1978    Years since quitting: 39.6  . Smokeless tobacco: Former NeurosurgeonUser    Types: Chew  Substance Use Topics  . Alcohol use: Yes    Alcohol/week: 12.0 standard drinks    Types: 12 Cans of beer per week    Comment: 12 cans of beer weekly     Family History  Problem Relation Age of Onset  . Cancer Mother        Lung Cancer  . Cancer Father  Blood dyscrasia  . Heart disease Father 29       Heart attack   . Diabetes Brother   . Cancer Maternal Grandmother        GI cancer  . Cancer Maternal Grandfather   . Cancer Paternal Grandmother   . Cancer Paternal Grandfather      Review of Systems  Constitutional: Negative.   HENT: Negative.   Eyes: Negative.   Respiratory: Negative.   Cardiovascular: Negative.   Gastrointestinal: Negative.   Genitourinary: Negative.   Musculoskeletal: Negative.   Skin: Negative.   Neurological: Negative.   Endo/Heme/Allergies: Negative.    Psychiatric/Behavioral: Negative.     Objective:  Physical Exam  Constitutional: He is oriented to person, place, and time. He appears well-developed.  HENT:  Head: Normocephalic.  Eyes: EOM are normal.  Neck: Normal range of motion.  Cardiovascular: Normal rate and intact distal pulses.  Respiratory: Effort normal.  GI: Soft.  Genitourinary:  Genitourinary Comments: Deferred  Musculoskeletal:  Left knee crepitus. Limited ROM and strength. Knee is stable.  Neurological: He is alert and oriented to person, place, and time.  Skin: Skin is warm and dry.  Psychiatric: His behavior is normal.    Vital signs in last 24 hours: BP: ()/()  Arterial Line BP: ()/()   Labs:   Estimated body mass index is 28.1 kg/m as calculated from the following:   Height as of 05/16/17: 6\' 1"  (1.854 m).   Weight as of 05/16/17: 96.6 kg.   Imaging Review Plain radiographs demonstrate moderate degenerative joint disease of the left knee(s). The overall alignment ismild varus. The bone quality appears to be good for age and reported activity level.   Preoperative templating of the joint replacement has been completed, documented, and submitted to the Operating Room personnel in order to optimize intra-operative equipment management.   Anticipated LOS equal to or greater than 2 midnights due to - Age 68 and older with one or more of the following:  - Obesity  - Expected need for hospital services (PT, OT, Nursing) required for safe  discharge  - Anticipated need for postoperative skilled nursing care or inpatient rehab  - Active co-morbidities: None OR   - Unanticipated findings during/Post Surgery: None  - Patient is a high risk of re-admission due to: None     Assessment/Plan:  End stage arthritis, left knee   The patient history, physical examination, clinical judgment of the provider and imaging studies are consistent with end stage degenerative joint disease of the left knee(s) and  total knee arthroplasty is deemed medically necessary. The treatment options including medical management, injection therapy arthroscopy and arthroplasty were discussed at length. The risks and benefits of total knee arthroplasty were presented and reviewed. The risks due to aseptic loosening, infection, stiffness, patella tracking problems, thromboembolic complications and other imponderables were discussed. The patient acknowledged the explanation, agreed to proceed with the plan and consent was signed. Patient is being admitted for inpatient treatment for surgery, pain control, PT, OT, prophylactic antibiotics, VTE prophylaxis, progressive ambulation and ADL's and discharge planning. The patient is planning to be discharged home with home health services.  Will use IV tranexamic acid. Contraindications and adverse affects of Tranexamic acid discussed in detail. Patient denies any of these at this time and understands the risks and benefits.

## 2017-12-30 ENCOUNTER — Encounter (HOSPITAL_COMMUNITY)
Admission: RE | Admit: 2017-12-30 | Discharge: 2017-12-30 | Disposition: A | Discharge status: Home / Self Care | Payer: Managed Care, Other (non HMO) | Source: Ambulatory Visit | Attending: Specialist | Admitting: Specialist

## 2017-12-30 ENCOUNTER — Encounter (HOSPITAL_COMMUNITY): Payer: Self-pay

## 2017-12-30 ENCOUNTER — Other Ambulatory Visit: Payer: Self-pay

## 2017-12-30 DIAGNOSIS — M1712 Unilateral primary osteoarthritis, left knee: Secondary | ICD-10-CM | POA: Insufficient documentation

## 2017-12-30 DIAGNOSIS — Z01818 Encounter for other preprocedural examination: Secondary | ICD-10-CM | POA: Diagnosis not present

## 2017-12-30 LAB — SURGICAL PCR SCREEN
MRSA, PCR: NEGATIVE
STAPHYLOCOCCUS AUREUS: NEGATIVE

## 2017-12-30 LAB — URINALYSIS, ROUTINE W REFLEX MICROSCOPIC
BILIRUBIN URINE: NEGATIVE
GLUCOSE, UA: NEGATIVE mg/dL
Hgb urine dipstick: NEGATIVE
KETONES UR: NEGATIVE mg/dL
Leukocytes, UA: NEGATIVE
Nitrite: NEGATIVE
Protein, ur: NEGATIVE mg/dL
SPECIFIC GRAVITY, URINE: 1.016 (ref 1.005–1.030)
pH: 5 (ref 5.0–8.0)

## 2017-12-30 LAB — CBC
HCT: 40.1 % (ref 39.0–52.0)
HEMOGLOBIN: 14.1 g/dL (ref 13.0–17.0)
MCH: 32.3 pg (ref 26.0–34.0)
MCHC: 35.2 g/dL (ref 30.0–36.0)
MCV: 92 fL (ref 78.0–100.0)
PLATELETS: 236 10*3/uL (ref 150–400)
RBC: 4.36 MIL/uL (ref 4.22–5.81)
RDW: 12.7 % (ref 11.5–15.5)
WBC: 4.4 10*3/uL (ref 4.0–10.5)

## 2017-12-30 LAB — BASIC METABOLIC PANEL
ANION GAP: 9 (ref 5–15)
BUN: 17 mg/dL (ref 6–20)
CO2: 25 mmol/L (ref 22–32)
Calcium: 9 mg/dL (ref 8.9–10.3)
Chloride: 108 mmol/L (ref 98–111)
Creatinine, Ser: 1.05 mg/dL (ref 0.61–1.24)
Glucose, Bld: 105 mg/dL — ABNORMAL HIGH (ref 70–99)
Potassium: 4.1 mmol/L (ref 3.5–5.1)
SODIUM: 142 mmol/L (ref 135–145)

## 2017-12-30 LAB — APTT: APTT: 27 s (ref 24–36)

## 2017-12-30 LAB — PROTIME-INR
INR: 0.89
Prothrombin Time: 12 seconds (ref 11.4–15.2)

## 2018-01-09 ENCOUNTER — Other Ambulatory Visit: Payer: Self-pay

## 2018-01-09 ENCOUNTER — Encounter (HOSPITAL_COMMUNITY): Payer: Self-pay

## 2018-01-09 ENCOUNTER — Inpatient Hospital Stay (HOSPITAL_COMMUNITY)
Admission: RE | Admit: 2018-01-09 | Discharge: 2018-01-10 | DRG: 470 | Disposition: A | Discharge status: Home Health | Payer: Managed Care, Other (non HMO) | Source: Ambulatory Visit | Attending: Specialist | Admitting: Specialist

## 2018-01-09 ENCOUNTER — Inpatient Hospital Stay (HOSPITAL_COMMUNITY): Payer: Managed Care, Other (non HMO) | Admitting: Anesthesiology

## 2018-01-09 ENCOUNTER — Encounter (HOSPITAL_COMMUNITY)
Admission: RE | Disposition: A | Discharge status: Home Health | Payer: Self-pay | Source: Ambulatory Visit | Attending: Specialist

## 2018-01-09 DIAGNOSIS — M179 Osteoarthritis of knee, unspecified: Secondary | ICD-10-CM | POA: Diagnosis present

## 2018-01-09 DIAGNOSIS — Z8249 Family history of ischemic heart disease and other diseases of the circulatory system: Secondary | ICD-10-CM | POA: Diagnosis not present

## 2018-01-09 DIAGNOSIS — F988 Other specified behavioral and emotional disorders with onset usually occurring in childhood and adolescence: Secondary | ICD-10-CM | POA: Diagnosis present

## 2018-01-09 DIAGNOSIS — Z6828 Body mass index (BMI) 28.0-28.9, adult: Secondary | ICD-10-CM | POA: Diagnosis not present

## 2018-01-09 DIAGNOSIS — Z87891 Personal history of nicotine dependence: Secondary | ICD-10-CM | POA: Diagnosis not present

## 2018-01-09 DIAGNOSIS — Z96651 Presence of right artificial knee joint: Secondary | ICD-10-CM | POA: Diagnosis present

## 2018-01-09 DIAGNOSIS — M25762 Osteophyte, left knee: Secondary | ICD-10-CM | POA: Diagnosis present

## 2018-01-09 DIAGNOSIS — M1710 Unilateral primary osteoarthritis, unspecified knee: Secondary | ICD-10-CM | POA: Diagnosis present

## 2018-01-09 DIAGNOSIS — E785 Hyperlipidemia, unspecified: Secondary | ICD-10-CM | POA: Diagnosis present

## 2018-01-09 DIAGNOSIS — E669 Obesity, unspecified: Secondary | ICD-10-CM | POA: Diagnosis present

## 2018-01-09 DIAGNOSIS — Z801 Family history of malignant neoplasm of trachea, bronchus and lung: Secondary | ICD-10-CM

## 2018-01-09 DIAGNOSIS — Z79899 Other long term (current) drug therapy: Secondary | ICD-10-CM | POA: Diagnosis not present

## 2018-01-09 DIAGNOSIS — Z96659 Presence of unspecified artificial knee joint: Secondary | ICD-10-CM

## 2018-01-09 DIAGNOSIS — M1712 Unilateral primary osteoarthritis, left knee: Secondary | ICD-10-CM | POA: Diagnosis present

## 2018-01-09 HISTORY — PX: TOTAL KNEE ARTHROPLASTY: SHX125

## 2018-01-09 LAB — TYPE AND SCREEN
ABO/RH(D): A POS
ANTIBODY SCREEN: NEGATIVE

## 2018-01-09 SURGERY — ARTHROPLASTY, KNEE, TOTAL
Anesthesia: Spinal | Site: Knee | Laterality: Left

## 2018-01-09 MED ORDER — ALUM & MAG HYDROXIDE-SIMETH 200-200-20 MG/5ML PO SUSP: 30.0000 mL | ORAL | Status: DC | PRN

## 2018-01-09 MED ORDER — ONDANSETRON HCL 4 MG/2ML IJ SOLN
INTRAMUSCULAR | Status: DC | PRN
  Administered 2018-01-09: 4 mg via INTRAVENOUS

## 2018-01-09 MED ORDER — DEXAMETHASONE SODIUM PHOSPHATE 10 MG/ML IJ SOLN
10.0000 mg | Freq: Once | INTRAMUSCULAR | Status: AC
  Administered 2018-01-10: 10 mg via INTRAVENOUS
  Filled 2018-01-09: qty 1

## 2018-01-09 MED ORDER — HYDROMORPHONE HCL 1 MG/ML IJ SOLN: 0.2500 mg | INTRAMUSCULAR | Status: DC | PRN

## 2018-01-09 MED ORDER — SODIUM CHLORIDE 0.9 % IJ SOLN
INTRAMUSCULAR | Status: DC | PRN
  Administered 2018-01-09: 45 mL

## 2018-01-09 MED ORDER — 0.9 % SODIUM CHLORIDE (POUR BTL) OPTIME
TOPICAL | Status: DC | PRN
  Administered 2018-01-09: 1000 mL

## 2018-01-09 MED ORDER — SODIUM CHLORIDE 0.9 % IR SOLN
Status: DC | PRN
  Administered 2018-01-09: 1000 mL

## 2018-01-09 MED ORDER — MIDAZOLAM HCL 2 MG/2ML IJ SOLN
INTRAMUSCULAR | Status: AC
  Filled 2018-01-09: qty 2

## 2018-01-09 MED ORDER — POLYETHYLENE GLYCOL 3350 17 G PO PACK: 17.0000 g | PACK | Freq: Every day | ORAL | Status: DC | PRN

## 2018-01-09 MED ORDER — PHENOL 1.4 % MT LIQD: 1.0000 | OROMUCOSAL | Status: DC | PRN

## 2018-01-09 MED ORDER — METHOCARBAMOL 500 MG PO TABS
500.0000 mg | ORAL_TABLET | Freq: Four times a day (QID) | ORAL | Status: DC | PRN
  Administered 2018-01-10 (×2): 500 mg via ORAL
  Filled 2018-01-09 (×2): qty 1

## 2018-01-09 MED ORDER — PROPOFOL 10 MG/ML IV BOLUS
INTRAVENOUS | Status: AC
  Filled 2018-01-09: qty 20

## 2018-01-09 MED ORDER — ACETAMINOPHEN 500 MG PO TABS
1000.0000 mg | ORAL_TABLET | Freq: Four times a day (QID) | ORAL | Status: DC
  Administered 2018-01-09 – 2018-01-10 (×2): 1000 mg via ORAL
  Filled 2018-01-09 (×2): qty 2

## 2018-01-09 MED ORDER — DOCUSATE SODIUM 100 MG PO CAPS
100.0000 mg | ORAL_CAPSULE | Freq: Two times a day (BID) | ORAL | Status: DC
  Administered 2018-01-09 – 2018-01-10 (×2): 100 mg via ORAL
  Filled 2018-01-09 (×2): qty 1

## 2018-01-09 MED ORDER — SODIUM CHLORIDE 0.9 % IJ SOLN
INTRAMUSCULAR | Status: AC
  Filled 2018-01-09: qty 50

## 2018-01-09 MED ORDER — KETOROLAC TROMETHAMINE 30 MG/ML IJ SOLN
INTRAMUSCULAR | Status: DC | PRN
  Administered 2018-01-09: 30 mg via INTRA_ARTICULAR

## 2018-01-09 MED ORDER — OXYCODONE HCL 5 MG PO TABS: 10.0000 mg | ORAL_TABLET | ORAL | Status: DC | PRN

## 2018-01-09 MED ORDER — TRANEXAMIC ACID 1000 MG/10ML IV SOLN
1000.0000 mg | INTRAVENOUS | Status: AC
  Administered 2018-01-09 (×2): 1000 mg via INTRAVENOUS
  Filled 2018-01-09: qty 10

## 2018-01-09 MED ORDER — LACTATED RINGERS IV SOLN
INTRAVENOUS | Status: DC
  Administered 2018-01-09 (×2): via INTRAVENOUS

## 2018-01-09 MED ORDER — CEFAZOLIN SODIUM-DEXTROSE 2-4 GM/100ML-% IV SOLN
2.0000 g | INTRAVENOUS | Status: AC
  Administered 2018-01-09: 2 g via INTRAVENOUS
  Filled 2018-01-09: qty 100

## 2018-01-09 MED ORDER — LACTATED RINGERS IV SOLN: INTRAVENOUS | Status: DC

## 2018-01-09 MED ORDER — CEFAZOLIN SODIUM-DEXTROSE 2-4 GM/100ML-% IV SOLN
2.0000 g | Freq: Four times a day (QID) | INTRAVENOUS | Status: AC
  Administered 2018-01-09 (×2): 2 g via INTRAVENOUS
  Filled 2018-01-09 (×2): qty 100

## 2018-01-09 MED ORDER — ONDANSETRON HCL 4 MG/2ML IJ SOLN: 4.0000 mg | Freq: Four times a day (QID) | INTRAMUSCULAR | Status: DC | PRN

## 2018-01-09 MED ORDER — STERILE WATER FOR IRRIGATION IR SOLN
Status: DC | PRN
  Administered 2018-01-09: 2000 mL

## 2018-01-09 MED ORDER — FENTANYL CITRATE (PF) 100 MCG/2ML IJ SOLN
50.0000 ug | INTRAMUSCULAR | Status: DC | PRN
  Administered 2018-01-09: 50 ug via INTRAVENOUS
  Filled 2018-01-09: qty 2

## 2018-01-09 MED ORDER — BUPIVACAINE-EPINEPHRINE (PF) 0.5% -1:200000 IJ SOLN
INTRAMUSCULAR | Status: DC | PRN
  Administered 2018-01-09: 15 mL

## 2018-01-09 MED ORDER — AMPHETAMINE-DEXTROAMPHET ER 10 MG PO CP24
60.0000 mg | ORAL_CAPSULE | Freq: Every day | ORAL | Status: DC
  Filled 2018-01-09: qty 6

## 2018-01-09 MED ORDER — CHLORHEXIDINE GLUCONATE 4 % EX LIQD: 60.0000 mL | Freq: Once | CUTANEOUS | Status: DC

## 2018-01-09 MED ORDER — METOCLOPRAMIDE HCL 5 MG/ML IJ SOLN: 5.0000 mg | Freq: Three times a day (TID) | INTRAMUSCULAR | Status: DC | PRN

## 2018-01-09 MED ORDER — FERROUS SULFATE 325 (65 FE) MG PO TABS
325.0000 mg | ORAL_TABLET | Freq: Three times a day (TID) | ORAL | Status: DC
  Administered 2018-01-10: 325 mg via ORAL
  Filled 2018-01-09: qty 1

## 2018-01-09 MED ORDER — ESCITALOPRAM OXALATE 10 MG PO TABS
10.0000 mg | ORAL_TABLET | Freq: Every day | ORAL | Status: DC
  Administered 2018-01-10: 10 mg via ORAL
  Filled 2018-01-09: qty 1

## 2018-01-09 MED ORDER — ACETAMINOPHEN 325 MG PO TABS
325.0000 mg | ORAL_TABLET | Freq: Four times a day (QID) | ORAL | Status: DC | PRN
  Administered 2018-01-10: 650 mg via ORAL
  Filled 2018-01-09: qty 2

## 2018-01-09 MED ORDER — MENTHOL 3 MG MT LOZG: 1.0000 | LOZENGE | OROMUCOSAL | Status: DC | PRN

## 2018-01-09 MED ORDER — PROPOFOL 500 MG/50ML IV EMUL
INTRAVENOUS | Status: DC | PRN
  Administered 2018-01-09: 125 ug/kg/min via INTRAVENOUS

## 2018-01-09 MED ORDER — ONDANSETRON HCL 4 MG PO TABS: 4.0000 mg | ORAL_TABLET | Freq: Four times a day (QID) | ORAL | Status: DC | PRN

## 2018-01-09 MED ORDER — PROPOFOL 10 MG/ML IV BOLUS
INTRAVENOUS | Status: AC
  Filled 2018-01-09: qty 40

## 2018-01-09 MED ORDER — DIPHENHYDRAMINE HCL 12.5 MG/5ML PO ELIX: 12.5000 mg | ORAL_SOLUTION | ORAL | Status: DC | PRN

## 2018-01-09 MED ORDER — METHOCARBAMOL 500 MG PO TABS: 500.0000 mg | ORAL_TABLET | Freq: Three times a day (TID) | ORAL | 1 refills | Status: AC | PRN

## 2018-01-09 MED ORDER — BUPIVACAINE-EPINEPHRINE (PF) 0.5% -1:200000 IJ SOLN
INTRAMUSCULAR | Status: AC
  Filled 2018-01-09: qty 30

## 2018-01-09 MED ORDER — ENOXAPARIN SODIUM 30 MG/0.3ML ~~LOC~~ SOLN
30.0000 mg | Freq: Two times a day (BID) | SUBCUTANEOUS | Status: DC
  Administered 2018-01-10: 30 mg via SUBCUTANEOUS
  Filled 2018-01-09: qty 0.3

## 2018-01-09 MED ORDER — KETOROLAC TROMETHAMINE 30 MG/ML IJ SOLN
INTRAMUSCULAR | Status: AC
  Filled 2018-01-09: qty 1

## 2018-01-09 MED ORDER — ASPIRIN EC 325 MG PO TBEC: 325.0000 mg | DELAYED_RELEASE_TABLET | Freq: Two times a day (BID) | ORAL | 3 refills | Status: AC

## 2018-01-09 MED ORDER — ONDANSETRON HCL 4 MG/2ML IJ SOLN
INTRAMUSCULAR | Status: AC
  Filled 2018-01-09: qty 2

## 2018-01-09 MED ORDER — ROPIVACAINE HCL 7.5 MG/ML IJ SOLN
INTRAMUSCULAR | Status: DC | PRN
  Administered 2018-01-09: 20 mL via PERINEURAL

## 2018-01-09 MED ORDER — METHOCARBAMOL 500 MG IVPB - SIMPLE MED
INTRAVENOUS | Status: AC
  Filled 2018-01-09: qty 50

## 2018-01-09 MED ORDER — BISACODYL 5 MG PO TBEC: 5.0000 mg | DELAYED_RELEASE_TABLET | Freq: Every day | ORAL | Status: DC | PRN

## 2018-01-09 MED ORDER — OXYCODONE HCL 5 MG PO TABS: 5.0000 mg | ORAL_TABLET | ORAL | 0 refills | Status: AC | PRN

## 2018-01-09 MED ORDER — OXYCODONE HCL 5 MG PO TABS
5.0000 mg | ORAL_TABLET | ORAL | Status: DC | PRN
  Administered 2018-01-09 (×2): 10 mg via ORAL
  Administered 2018-01-10 (×3): 5 mg via ORAL
  Filled 2018-01-09 (×2): qty 2
  Filled 2018-01-09 (×2): qty 1
  Filled 2018-01-09: qty 2

## 2018-01-09 MED ORDER — DEXAMETHASONE SODIUM PHOSPHATE 10 MG/ML IJ SOLN
INTRAMUSCULAR | Status: AC
  Filled 2018-01-09: qty 1

## 2018-01-09 MED ORDER — MIDAZOLAM HCL 2 MG/2ML IJ SOLN
1.0000 mg | INTRAMUSCULAR | Status: DC | PRN
  Administered 2018-01-09: 2 mg via INTRAVENOUS
  Filled 2018-01-09: qty 2

## 2018-01-09 MED ORDER — MAGNESIUM CITRATE PO SOLN: 1.0000 | Freq: Once | ORAL | Status: DC | PRN

## 2018-01-09 MED ORDER — METHOCARBAMOL 500 MG IVPB - SIMPLE MED
500.0000 mg | Freq: Four times a day (QID) | INTRAVENOUS | Status: DC | PRN
  Administered 2018-01-09: 500 mg via INTRAVENOUS
  Filled 2018-01-09: qty 50

## 2018-01-09 MED ORDER — METOCLOPRAMIDE HCL 5 MG PO TABS: 5.0000 mg | ORAL_TABLET | Freq: Three times a day (TID) | ORAL | Status: DC | PRN

## 2018-01-09 MED ORDER — SODIUM CHLORIDE 0.9 % IV SOLN
INTRAVENOUS | Status: DC
  Administered 2018-01-09: 75 mL/h via INTRAVENOUS

## 2018-01-09 MED ORDER — BUPIVACAINE IN DEXTROSE 0.75-8.25 % IT SOLN
INTRATHECAL | Status: DC | PRN
  Administered 2018-01-09: 15 mg via INTRATHECAL

## 2018-01-09 MED ORDER — PROMETHAZINE HCL 25 MG/ML IJ SOLN: 6.2500 mg | INTRAMUSCULAR | Status: DC | PRN

## 2018-01-09 MED ORDER — MIDAZOLAM HCL 5 MG/5ML IJ SOLN
INTRAMUSCULAR | Status: DC | PRN
  Administered 2018-01-09: 2 mg via INTRAVENOUS

## 2018-01-09 MED ORDER — MEPERIDINE HCL 50 MG/ML IJ SOLN: 6.2500 mg | INTRAMUSCULAR | Status: DC | PRN

## 2018-01-09 MED ORDER — HYDROMORPHONE HCL 1 MG/ML IJ SOLN: 0.5000 mg | INTRAMUSCULAR | Status: DC | PRN

## 2018-01-09 MED ORDER — DEXAMETHASONE SODIUM PHOSPHATE 10 MG/ML IJ SOLN
10.0000 mg | Freq: Once | INTRAMUSCULAR | Status: AC
  Administered 2018-01-09: 10 mg via INTRAVENOUS

## 2018-01-09 SURGICAL SUPPLY — 62 items
ADH SKN CLS APL DERMABOND .7 (GAUZE/BANDAGES/DRESSINGS) ×1
BAG DECANTER FOR FLEXI CONT (MISCELLANEOUS) IMPLANT
BAG SPEC THK2 15X12 ZIP CLS (MISCELLANEOUS) ×2
BAG ZIPLOCK 12X15 (MISCELLANEOUS) ×4 IMPLANT
BANDAGE ACE 4X5 VEL STRL LF (GAUZE/BANDAGES/DRESSINGS) ×2 IMPLANT
BANDAGE ACE 6X5 VEL STRL LF (GAUZE/BANDAGES/DRESSINGS) ×2 IMPLANT
BLADE SAG 18X100X1.27 (BLADE) ×2 IMPLANT
BLADE SAW SGTL 13.0X1.19X90.0M (BLADE) ×2 IMPLANT
BOWL SMART MIX CTS (DISPOSABLE) ×2 IMPLANT
CEMENT HV SMART SET (Cement) ×2 IMPLANT
CEMENT TIBIA MBT SIZE 5 (Knees) IMPLANT
COVER SURGICAL LIGHT HANDLE (MISCELLANEOUS) ×2 IMPLANT
CUFF TOURN SGL QUICK 34 (TOURNIQUET CUFF) ×2
CUFF TRNQT CYL 34X4X40X1 (TOURNIQUET CUFF) ×1 IMPLANT
DECANTER SPIKE VIAL GLASS SM (MISCELLANEOUS) ×2 IMPLANT
DERMABOND ADVANCED (GAUZE/BANDAGES/DRESSINGS) ×1
DERMABOND ADVANCED .7 DNX12 (GAUZE/BANDAGES/DRESSINGS) ×1 IMPLANT
DRAPE U-SHAPE 47X51 STRL (DRAPES) ×2 IMPLANT
DRSG AQUACEL AG ADV 3.5X10 (GAUZE/BANDAGES/DRESSINGS) ×2 IMPLANT
DRSG TEGADERM 4X4.75 (GAUZE/BANDAGES/DRESSINGS) ×2 IMPLANT
DURAPREP 26ML APPLICATOR (WOUND CARE) ×4 IMPLANT
ELECT REM PT RETURN 15FT ADLT (MISCELLANEOUS) ×2 IMPLANT
EVACUATOR 1/8 PVC DRAIN (DRAIN) ×2 IMPLANT
FEMUR SIGMA PS SZ 6.0 L (Femur) ×1 IMPLANT
GAUZE SPONGE 2X2 8PLY STRL LF (GAUZE/BANDAGES/DRESSINGS) ×1 IMPLANT
GLOVE BIO SURGEON STRL SZ7.5 (GLOVE) ×4 IMPLANT
GLOVE BIOGEL PI IND STRL 8 (GLOVE) ×2 IMPLANT
GLOVE BIOGEL PI INDICATOR 8 (GLOVE) ×2
GLOVE ECLIPSE 8.0 STRL XLNG CF (GLOVE) ×4 IMPLANT
GLOVE SURG ORTHO 9.0 STRL STRW (GLOVE) ×2 IMPLANT
GOWN STRL REUS W/TWL XL LVL3 (GOWN DISPOSABLE) ×4 IMPLANT
HANDPIECE INTERPULSE COAX TIP (DISPOSABLE) ×2
HOLDER FOLEY CATH W/STRAP (MISCELLANEOUS) IMPLANT
IMMOBILIZER KNEE 20 (SOFTGOODS) ×3 IMPLANT
IMMOBILIZER KNEE 20 THIGH 36 (SOFTGOODS) ×1 IMPLANT
NS IRRIG 1000ML POUR BTL (IV SOLUTION) ×2 IMPLANT
PACK TOTAL KNEE CUSTOM (KITS) ×2 IMPLANT
PATELLA DOME PFC 38MM (Knees) ×1 IMPLANT
PIN STEINMAN FIXATION KNEE (PIN) ×1 IMPLANT
PLATE ROT INSERT 10MM SIZE 6 (Plate) ×1 IMPLANT
POSITIONER SURGICAL ARM (MISCELLANEOUS) ×2 IMPLANT
SET HNDPC FAN SPRY TIP SCT (DISPOSABLE) ×1 IMPLANT
SET PAD KNEE POSITIONER (MISCELLANEOUS) ×2 IMPLANT
SPONGE GAUZE 2X2 STER 10/PKG (GAUZE/BANDAGES/DRESSINGS) ×1
SPONGE LAP 18X18 RF (DISPOSABLE) IMPLANT
SPONGE SURGIFOAM ABS GEL 100 (HEMOSTASIS) ×2 IMPLANT
STOCKINETTE 6  STRL (DRAPES) ×1
STOCKINETTE 6 STRL (DRAPES) ×1 IMPLANT
SUT BONE WAX W31G (SUTURE) IMPLANT
SUT MNCRL AB 3-0 PS2 18 (SUTURE) ×2 IMPLANT
SUT VIC AB 1 CT1 27 (SUTURE) ×8
SUT VIC AB 1 CT1 27XBRD ANTBC (SUTURE) ×4 IMPLANT
SUT VIC AB 2-0 CT1 27 (SUTURE) ×4
SUT VIC AB 2-0 CT1 TAPERPNT 27 (SUTURE) ×2 IMPLANT
SUT VLOC 180 0 24IN GS25 (SUTURE) ×2 IMPLANT
SYR 50ML LL SCALE MARK (SYRINGE) ×2 IMPLANT
TAPE STRIPS DRAPE STRL (GAUZE/BANDAGES/DRESSINGS) ×2 IMPLANT
TIBIA MBT CEMENT SIZE 5 (Knees) ×2 IMPLANT
TRAY FOLEY MTR SLVR 16FR STAT (SET/KITS/TRAYS/PACK) ×2 IMPLANT
WATER STERILE IRR 1000ML POUR (IV SOLUTION) ×4 IMPLANT
WRAP KNEE MAXI GEL POST OP (GAUZE/BANDAGES/DRESSINGS) ×2 IMPLANT
YANKAUER SUCT BULB TIP 10FT TU (MISCELLANEOUS) ×2 IMPLANT

## 2018-01-09 NOTE — Interval H&P Note (Signed)
History and Physical Interval Note:  01/09/2018 10:11 AM  Natasha Bence  has presented today for surgery, with the diagnosis of Left knee osteoarthritis  The various methods of treatment have been discussed with the patient and family. After consideration of risks, benefits and other options for treatment, the patient has consented to  Procedure(s): LEFT TOTAL KNEE ARTHROPLASTY (Left) as a surgical intervention .  The patient's history has been reviewed, patient examined, no change in status, stable for surgery.  I have reviewed the patient's chart and labs.  Questions were answered to the patient's satisfaction.     Fynlee Rowlands ANDREW

## 2018-01-09 NOTE — Op Note (Signed)
DATE OF SURGERY:  01/09/2018  TIME: 12:51 PM  PATIENT NAME:  Dean Cannon    AGE: 62 y.o.   PRE-OPERATIVE DIAGNOSIS:  Left knee osteoarthritis  POST-OPERATIVE DIAGNOSIS:  Left knee osteoarthritis  PROCEDURE:  Procedure(s): LEFT TOTAL KNEE ARTHROPLASTY  SURGEON:  Thatiana Renbarger ANDREW  ASSISTANT:  Bryson Stilwell, PA-C, present and scrubbed throughout the case, critical for assistance with exposure, retraction, instrumentation, and closure.  OPERATIVE IMPLANTS: Depuy PFC Sigma Rotating Platform.  Femur size 6 Tibia size 5, Patella size 38 3-peg oval button, with a 10 mm polyethylene insert.   PREOPERATIVE INDICATIONS:   Dean Cannon is a 63 y.o. year old male with end stage bone on bone arthritis of the knee who failed conservative treatment and elected for Total Knee Arthroplasty.   The risks, benefits, and alternatives were discussed at length including but not limited to the risks of infection, bleeding, nerve injury, stiffness, blood clots, the need for revision surgery, cardiopulmonary complications, among others, and they were willing to proceed.  OPERATIVE DESCRIPTION:  The patient was brought to the operative room and placed in a supine position.  Spinal anesthesia was administered.  IV antibiotics were given.  The lower extremity was prepped and draped in the usual sterile fashion.  Time out was performed.  The leg was elevated and exsanguinated and the tourniquet was inflated.  Anterior quadriceps tendon splitting approach was performed.  The patella was retracted and osteophytes were removed.  The anterior horn of the medial and lateral meniscus was removed and cruciate ligaments resected.   The distal femur was opened with the drill and the intramedullary distal femoral cutting jig was utilized, set at 5 degrees resecting 10 mm off the distal femur.  Care was taken to protect the collateral ligaments.  The distal femoral sizing jig was applied, taking care to avoid  notching.  Then the 4-in-1 cutting jig was applied and the anterior and posterior femur was cut, along with the chamfer cuts.    Then the extramedullary tibial cutting jig was utilized making the appropriate cut using the anterior tibial crest as a reference building in appropriate posterior slope.  Care was taken during the cut to protect the medial and collateral ligaments.  The proximal tibia was removed along with the posterior horns of the menisci.   The posterior medial femoral osteophytes and posterior lateral femoral osteophytes were removed.    The flexion gap was then measured and was symmetric with the extension gap, measured at 10.  I completed the distal femoral preparation using the appropriate jig to prepare the box.  The patella was then measured, and cut with the saw.    The proximal tibia sized and prepared accordingly with the reamer and the punch, and then all components were trialed with the trial insert.  The knee was found to have excellent balance and full motion.    The above named components were then cemented into place and all excess cement was removed.  The trial polyethylene component was in place during cementation, and then was exchanged for the real polyethylene component.    The knee was easily taken through a range of motion and the patella tracked well and the knee irrigated copiously and the parapatellar and subcutaneous tissue closed with vicryl, and monocryl with steri strips for the skin.  The arthrotomy was closed at 90 of flexion. The wounds were dressed with sterile gauze and the tourniquet released and the patient was awakened and returned to the PACU  in stable and satisfactory condition.  There were no complications.  Total tourniquet time was 70 minutes.

## 2018-01-09 NOTE — Anesthesia Preprocedure Evaluation (Addendum)
Anesthesia Evaluation  Patient identified by MRN, date of birth, ID band Patient awake    Reviewed: Allergy & Precautions, NPO status , Patient's Chart, lab work & pertinent test results  Airway Mallampati: I  TM Distance: >3 FB Neck ROM: Full    Dental  (+) Teeth Intact, Dental Advisory Given   Pulmonary former smoker,    breath sounds clear to auscultation       Cardiovascular negative cardio ROS   Rhythm:Regular Rate:Normal     Neuro/Psych    GI/Hepatic negative GI ROS, Neg liver ROS,   Endo/Other  negative endocrine ROS  Renal/GU negative Renal ROS  negative genitourinary   Musculoskeletal  (+) Arthritis ,   Abdominal Normal abdominal exam  (+)   Peds negative pediatric ROS (+)  Hematology negative hematology ROS (+)   Anesthesia Other Findings - HLD  Reproductive/Obstetrics                            Lab Results  Component Value Date   WBC 4.4 12/30/2017   HGB 14.1 12/30/2017   HCT 40.1 12/30/2017   MCV 92.0 12/30/2017   PLT 236 12/30/2017   Lab Results  Component Value Date   INR 0.89 12/30/2017   INR 0.86 12/22/2015     Anesthesia Physical Anesthesia Plan  ASA: II  Anesthesia Plan: Spinal   Post-op Pain Management:  Regional for Post-op pain   Induction: Intravenous  PONV Risk Score and Plan: Propofol infusion, Ondansetron, Midazolam and Dexamethasone  Airway Management Planned: Simple Face Mask  Additional Equipment: None  Intra-op Plan:   Post-operative Plan:   Informed Consent: I have reviewed the patients History and Physical, chart, labs and discussed the procedure including the risks, benefits and alternatives for the proposed anesthesia with the patient or authorized representative who has indicated his/her understanding and acceptance.   Dental advisory given  Plan Discussed with: CRNA  Anesthesia Plan Comments:        Anesthesia Quick  Evaluation

## 2018-01-09 NOTE — Transfer of Care (Signed)
Immediate Anesthesia Transfer of Care Note  Patient: Dean Cannon  Procedure(s) Performed: LEFT TOTAL KNEE ARTHROPLASTY (Left Knee)  Patient Location: PACU  Anesthesia Type:Regional and Spinal  Level of Consciousness: sedated  Airway & Oxygen Therapy: Patient Spontanous Breathing and Patient connected to face mask oxygen  Post-op Assessment: Report given to RN and Post -op Vital signs reviewed and stable  Post vital signs: Reviewed and stable  Last Vitals:  Vitals Value Taken Time  BP    Temp    Pulse    Resp    SpO2      Last Pain:  Vitals:   01/09/18 0931  TempSrc:   PainSc: 0-No pain         Complications: No apparent anesthesia complications

## 2018-01-09 NOTE — Progress Notes (Signed)
Assisted Dr. Hollis with left, ultrasound guided, adductor canal block. Side rails up, monitors on throughout procedure. See vital signs in flow sheet. Tolerated Procedure well.  

## 2018-01-09 NOTE — Evaluation (Signed)
Physical Therapy Evaluation Patient Details Name: Dean Cannon MRN: [id number] DOB: 02-06-1956 Today's Date: 01/09/2018   History of Present Illness  62 YO male s/p L TKR on 01/09/18. PMH includes R TKR, HLD, OA, ADD, R shoulder surgery.   Clinical Impression  Pt presents with increased time and effort to perform mobility tasks, decreased tolerance for ambulation, and L knee pain. Pt would benefit from acute PT to address deficits. Pt ambulated 100 ft in hallway today with RW with min guard assist. PT to progress pt mobility as tolerated.     Follow Up Recommendations Follow surgeon's recommendation for DC plan and follow-up therapies;Supervision for mobility/OOB    Equipment Recommendations  None recommended by PT    Recommendations for Other Services       Precautions / Restrictions Precautions Precautions: Fall Required Braces or Orthoses: Knee Immobilizer - Left Knee Immobilizer - Left: On when out of bed or walking;Discontinue once straight leg raise with < 10 degree lag Restrictions Weight Bearing Restrictions: No Other Position/Activity Restrictions: WBAT       Mobility  Bed Mobility Overal bed mobility: Needs Assistance Bed Mobility: Supine to Sit     Supine to sit: Supervision     General bed mobility comments: increased time and effort, especially for LLE management.   Transfers Overall transfer level: Needs assistance Equipment used: Rolling walker (2 wheeled) Transfers: Sit to/from Stand Sit to Stand: Min guard         General transfer comment: Min guard for safety. Pt able to weight shift L and R without difficulty. Verbal cuing for hand placement.   Ambulation/Gait Ambulation/Gait assistance: Min guard Gait Distance (Feet): 100 Feet Assistive device: Rolling walker (2 wheeled) Gait Pattern/deviations: Step-to pattern;Decreased weight shift to left Gait velocity: slightly decr   General Gait Details: min guard for safety, verbal cuing for  sequencing initially   Stairs            Wheelchair Mobility    Modified Rankin (Stroke Patients Only)       Balance Overall balance assessment: Modified Independent                                           Pertinent Vitals/Pain Pain Assessment: 0-10 Pain Score: 1  Pain Location: L knee  Pain Descriptors / Indicators: Sore;Operative site guarding Pain Intervention(s): Limited activity within patient's tolerance;Ice applied;Repositioned;Monitored during session;Premedicated before session    Home Living Family/patient expects to be discharged to:: Private residence Living Arrangements: Spouse/significant other Available Help at Discharge: Family Type of Home: House Home Access: Stairs to enter Entrance Stairs-Rails: None Entrance Stairs-Number of Steps: 1 Home Layout: Two level Home Equipment: Environmental consultant - 2 wheels;Crutches      Prior Function Level of Independence: Independent               Hand Dominance   Dominant Hand: Right    Extremity/Trunk Assessment   Upper Extremity Assessment Upper Extremity Assessment: Overall WFL for tasks assessed    Lower Extremity Assessment Lower Extremity Assessment: Overall WFL for tasks assessed;LLE deficits/detail LLE Deficits / Details: suspected post-surgical LLE weakness; able to perform 5 quad sets, ankle pumps, and SLR x2 with bed mobility and immobilizer donning LLE Sensation: WNL    Cervical / Trunk Assessment Cervical / Trunk Assessment: Normal  Communication   Communication: No difficulties  Cognition Arousal/Alertness: Awake/alert Behavior During Therapy: [facility]  for tasks assessed/performed Overall Cognitive Status: Within Functional Limits for tasks assessed                                        General Comments      Exercises Total Joint Exercises Ankle Circles/Pumps: AROM;Both;10 reps;Supine Quad Sets: AROM;Left;5 reps;Supine   Assessment/Plan    PT  Assessment Patient needs continued PT services  PT Problem List Decreased strength;Pain;Decreased range of motion;Decreased activity tolerance;Decreased mobility;Decreased knowledge of use of DME       PT Treatment Interventions DME instruction;Therapeutic activities;Gait training;Patient/family education;Therapeutic exercise;Stair training;Balance training;Functional mobility training    PT Goals (Current goals can be found in the Care Plan section)  Acute Rehab PT Goals PT Goal Formulation: With patient Time For Goal Achievement: 01/23/18 Potential to Achieve Goals: Good    Frequency     Barriers to discharge        Co-evaluation               AM-PAC PT "6 Clicks" Daily Activity  Outcome Measure Difficulty turning over in bed (including adjusting bedclothes, sheets and blankets)?: None Difficulty moving from lying on back to sitting on the side of the bed? : None Difficulty sitting down on and standing up from a chair with arms (e.g., wheelchair, bedside commode, etc,.)?: A Little Help needed moving to and from a bed to chair (including a wheelchair)?: A Little Help needed walking in hospital room?: None Help needed climbing 3-5 steps with a railing? : A Little 6 Click Score: 21    End of Session Equipment Utilized During Treatment: Gait belt Activity Tolerance: Patient tolerated treatment well;No increased pain Patient left: in chair;with call bell/phone within reach;with family/visitor present;with nursing/sitter in room(pt instructed to not get up without assistance, pt verbally agreed to press nurse button for all mobility ) Nurse Communication: Mobility status PT Visit Diagnosis: Other abnormalities of gait and mobility (R26.89)    Time: 1735-1802 PT Time Calculation (min) (ACUTE ONLY): 27 min   Charges:   PT Evaluation $PT Eval Low Complexity: 1 Low PT Treatments $Gait Training: 8-22 mins       Colon Rueth Terrial Rhodes, PT, DPT  Pager # [phone]    Branndon Tuite D  Alayia Meggison 01/09/2018, 7:49 PM

## 2018-01-09 NOTE — Anesthesia Procedure Notes (Signed)
Anesthesia Regional Block: Adductor canal block   Pre-Anesthetic Checklist: ,, timeout performed, Correct Patient, Correct Site, Correct Laterality, Correct Procedure, Correct Position, site marked, Risks and benefits discussed,  Surgical consent,  Pre-op evaluation,  At surgeon's request and post-op pain management  Laterality: Left  Prep: chloraprep       Needles:  Injection technique: Single-shot  Needle Type: Echogenic Stimulator Needle     Needle Length: 9cm  Needle Gauge: 21     Additional Needles:   Procedures:,,,, ultrasound used (permanent image in chart),,,,  Narrative:  Start time: 01/09/2018 9:25 AM End time: 01/09/2018 9:30 AM Injection made incrementally with aspirations every 5 mL.  Performed by: Personally  Anesthesiologist: Shelton Silvas, MD  Additional Notes: Patient tolerated the procedure well. Local anesthetic introduced in an incremental fashion under minimal resistance after negative aspirations. No paresthesias were elicited. After completion of the procedure, no acute issues were identified and patient continued to be monitored by RN.

## 2018-01-09 NOTE — Interval H&P Note (Signed)
History and Physical Interval Note:  01/09/2018 7:37 AM  Dean Cannon  has presented today for surgery, with the diagnosis of Left knee osteoarthritis  The various methods of treatment have been discussed with the patient and family. After consideration of risks, benefits and other options for treatment, the patient has consented to  Procedure(s): LEFT TOTAL KNEE ARTHROPLASTY (Left) as a surgical intervention .  The patient's history has been reviewed, patient examined, no change in status, stable for surgery.  I have reviewed the patient's chart and labs.  Questions were answered to the patient's satisfaction.     [name] ANDREW

## 2018-01-10 LAB — CBC
HEMATOCRIT: 36 % — AB (ref 39.0–52.0)
Hemoglobin: 12.6 g/dL — ABNORMAL LOW (ref 13.0–17.0)
MCH: 32.7 pg (ref 26.0–34.0)
MCHC: 35 g/dL (ref 30.0–36.0)
MCV: 93.5 fL (ref 78.0–100.0)
PLATELETS: 221 10*3/uL (ref 150–400)
RBC: 3.85 MIL/uL — ABNORMAL LOW (ref 4.22–5.81)
RDW: 12.7 % (ref 11.5–15.5)
WBC: 12.3 10*3/uL — ABNORMAL HIGH (ref 4.0–10.5)

## 2018-01-10 LAB — BASIC METABOLIC PANEL
ANION GAP: 6 (ref 5–15)
BUN: 16 mg/dL (ref 6–20)
CALCIUM: 8.5 mg/dL — AB (ref 8.9–10.3)
CO2: 27 mmol/L (ref 22–32)
Chloride: 108 mmol/L (ref 98–111)
Creatinine, Ser: 1.03 mg/dL (ref 0.61–1.24)
GFR calc Af Amer: >60 mL/min (ref >60)
GLUCOSE: 156 mg/dL — AB (ref 70–99)
POTASSIUM: 4.3 mmol/L (ref 3.5–5.1)
Sodium: 141 mmol/L (ref 135–145)

## 2018-01-10 NOTE — Progress Notes (Signed)
Pt d/c home with family in stable condition. No equipment needed at discharge. RN medicated pt for pain prior to d/c. Pt to follow up with ortho doc in two weeks. He will call for appointment. Home health arranged for pt as well.

## 2018-01-10 NOTE — Plan of Care (Signed)
Pt stable to d/c home today. No needs at this time.  

## 2018-01-10 NOTE — Progress Notes (Signed)
     Subjective: 1 Day Post-Op Procedure(s) (LRB): LEFT TOTAL KNEE ARTHROPLASTY (Left)   Patient reports pain as mild, pain controlled. No events throughout the night.  He knows what he is getting in to as he has had the other knee replaced.  HV drain removed.  He states that he feels good and feels ready to be discharged home after PT.   Patient's anticipated LOS is less than 2 midnights, meeting these requirements: - Younger than 81 - Lives within 1 hour of care - Has a competent adult at home to recover with post-op recover - NO history of  - Chronic pain requiring opiods  - Diabetes  - Coronary Artery Disease  - Heart failure  - Heart attack  - Stroke  - DVT/VTE  - Cardiac arrhythmia  - Respiratory Failure/COPD  - Renal failure  - Anemia  - Advanced Liver disease       Objective:   VITALS:   Vitals:   01/09/18 2158 01/10/18 0520  BP: 119/65 137/71  Pulse: (!) 53 (!) 50  Resp: 16 16  Temp: 98.3 F (36.8 C) (!) 97.5 F (36.4 C)  SpO2: 93% 94%    Dorsiflexion/Plantar flexion intact Incision: dressing C/D/I No cellulitis present Compartment soft  LABS Recent Labs    01/10/18 0410  HGB 12.6*  HCT 36.0*  WBC 12.3*  PLT 221    Recent Labs    01/10/18 0410  NA 141  K 4.3  BUN 16  CREATININE 1.03  GLUCOSE 156*     Assessment/Plan: 1 Day Post-Op Procedure(s) (LRB): LEFT TOTAL KNEE ARTHROPLASTY (Left) Foley cath d/c'ed Advance diet Up with therapy D/C IV fluids Discharge home Follow up in 2 weeks at [facility]). Follow up with Dr. Thomasena Edis in 2 weeks.  Contact information:  [facility]) [address], [address] [place] [address] [phone]    Overweight (BMI 25-29.9) Estimated body mass index is 28.25 kg/m as calculated from the following:   Height as of this encounter: 6\' 2"  (1.88 m).   Weight as of this encounter: 99.8 kg. Patient also counseled that  weight may inhibit the healing process Patient counseled that losing weight will help with future health issues         Anastasio Auerbach. Tyresha Fede   PAC  01/10/2018, 8:22 AM

## 2018-01-10 NOTE — Care Management Note (Signed)
Case Management Note  Patient Details  Name: MELTON FINERAN MRN: [id number] Date of Birth: 1955-08-23  Subjective/Objective:      Left TKA              Action/Plan: NCM spoke to pt and offered choice for [facility]. Pt agreeable to [facility] for HHPT. Pt has RW and 3n1 from previous surgery. Wife at home to assist with care.   Expected Discharge Date:  01/10/18               Expected Discharge Plan:  Home w Home Health Services  In-House Referral:  NA  Discharge planning Services  CM Consult  Post Acute Care Choice:  Home Health Choice offered to:  Patient  DME Arranged:  N/A DME Agency:  NA  HH Arranged:  PT HH Agency:  [facility]  Status of Service:  Completed, signed off  If discussed at [employer], dates discussed:    Additional Comments:  Elliot Cousin, RN 01/10/2018, 10:02 AM

## 2018-01-10 NOTE — Progress Notes (Signed)
Physical Therapy Treatment Patient Details Name: Dean Cannon MRN: [id number] DOB: [birth year] Today's Date: 01/10/2018    History of Present Illness 62 YO male s/p L TKR on 01/09/18. PMH includes R TKR, HLD, OA, ADD, R shoulder surgery.     PT Comments    Progressing well with mobility. Reviewed exercises, gait training, and stair training. All education completed. Okay to d/c from PT standpoint-made RN aware. Pt is set to d/c home with HHPT.    Follow Up Recommendations        Equipment Recommendations       Recommendations for Other Services       Precautions / Restrictions Precautions Precautions: Fall Required Braces or Orthoses: Knee Immobilizer - Left Knee Immobilizer - Left: (did not use-able to SLR 9/7) Restrictions Weight Bearing Restrictions: No Other Position/Activity Restrictions: WBAT     Mobility  Bed Mobility Overal bed mobility: Modified Independent                Transfers Overall transfer level: Needs assistance Equipment used: Rolling walker (2 wheeled) Transfers: Sit to/from Stand Sit to Stand: Supervision         General transfer comment: VCs hand placement  Ambulation/Gait Ambulation/Gait assistance: Supervision Gait Distance (Feet): 200 Feet Assistive device: Rolling walker (2 wheeled) Gait Pattern/deviations: Step-through pattern     General Gait Details: Steady gait speed. Good stride length and heel toe pattern.    Stairs Stairs: Yes Stairs assistance: Supervision Stair Management: One rail Left;Step to pattern;Forwards Number of Stairs: 3 General stair comments: VCs safety, sequence.    Wheelchair Mobility    Modified Rankin (Stroke Patients Only)       Balance                                            Cognition Arousal/Alertness: Awake/alert Behavior During Therapy: WFL for tasks assessed/performed Overall Cognitive Status: Within Functional Limits for tasks assessed                                         Exercises Total Joint Exercises Ankle Circles/Pumps: AROM;Both;10 reps;Supine Quad Sets: AROM;Left;Supine;10 reps Hip ABduction/ADduction: AROM;Left;10 reps;Supine Straight Leg Raises: AROM;Left;10 reps;Supine Long Arc Quad: AROM;Left;10 reps;Seated Knee Flexion: AROM;Left;10 reps;Seated Goniometric ROM: 0-85 degrees    General Comments        Pertinent Vitals/Pain Pain Assessment: 0-10 Pain Score: 3  Pain Location: L knee  Pain Descriptors / Indicators: Sore Pain Intervention(s): Monitored during session;Repositioned    Home Living                      Prior Function            PT Goals (current goals can now be found in the care plan section) Progress towards PT goals: Progressing toward goals    Frequency           PT Plan Current plan remains appropriate    Co-evaluation              AM-PAC PT "6 Clicks" Daily Activity  Outcome Measure                   End of Session   Activity Tolerance: Patient tolerated treatment well Patient left: in chair;with call  bell/phone within reach         Time: 1025-1039 PT Time Calculation (min) (ACUTE ONLY): 14 min  Charges:  $Gait Training: 8-22 mins                        Rebeca Alert, MPT Pager: [phone]

## 2018-01-12 ENCOUNTER — Encounter (HOSPITAL_COMMUNITY): Payer: Self-pay | Admitting: Specialist

## 2018-01-12 NOTE — Anesthesia Postprocedure Evaluation (Signed)
Anesthesia Post Note  Patient: Dean Cannon  Procedure(s) Performed: LEFT TOTAL KNEE ARTHROPLASTY (Left Knee)     Patient location during evaluation: PACU Anesthesia Type: Spinal Level of consciousness: oriented and awake and alert Pain management: pain level controlled Vital Signs Assessment: post-procedure vital signs reviewed and stable Respiratory status: spontaneous breathing, respiratory function stable and patient connected to nasal cannula oxygen Cardiovascular status: blood pressure returned to baseline and stable Postop Assessment: no headache, no backache, no apparent nausea or vomiting, spinal receding and patient able to bend at knees Anesthetic complications: no    Last Vitals:  Vitals:   01/10/18 0520 01/10/18 0956  BP: 137/71 (!) 142/78  Pulse: (!) 50 (!) 56  Resp: 16 16  Temp: (!) 36.4 C 36.7 C  SpO2: 94% 95%    Last Pain:  Vitals:   01/10/18 1125  TempSrc:   PainSc: 4                  Shelton Silvas

## 2018-01-14 ENCOUNTER — Encounter: Payer: Self-pay | Admitting: *Deleted

## 2018-01-14 ENCOUNTER — Other Ambulatory Visit: Payer: Self-pay | Admitting: *Deleted

## 2018-01-14 NOTE — Patient Outreach (Signed)
[facility]) Care Management  01/14/2018  Dean Cannon [birth year] [id number]   Subjective: Telephone call to patient's home number, spoke with patient, and HIPAA verified.  Discussed [facility] Care Management [employer] Transition of care follow up, patient voiced understanding, and is in agreement to follow up.    Patient he is doing good, currently visiting with sister, home health physical therapy going well, and has a follow up appointment with surgeon on 01/19/18.  Patient states he is able to manage self care and has assistance as needed.  Patient voices understanding of medical diagnosis, surgery, and treatment plan. States he is accessing his Rosann Auerbach benefits as needed via member services number on back of card or through [email or web address] Patient states he does not have any education material, transition of care, care coordination, disease management, disease monitoring, transportation, community resource, or pharmacy needs at this time.  States he is very appreciative of the follow up and is in agreement to receive [facility] Care Management information.      Objective:  Per KPN (Knowledge Performance Now, point of care tool), [employer] i[employer], and chart review, patient hospitalized 01/09/18 -01/10/18 for Left knee osteoarthritis, status post LEFT TOTAL KNEE ARTHROPLASTY on 01/09/18.   Patient also has a history of hyperlipidemia and ADD (attention deficit disorder).      Assessment:  Received [employer] Transition of care referral on 01/14/18.   Transition of care follow up completed, no care management needs, and will proceed with case closure.       Plan: RNCM will send patient successful outreach letter, [facility] pamphlet, and magnet. RNCM will complete case closure due to follow up completed / no care management needs.       Avangeline Stockburger H. Gardiner Barefoot, BSN, CCM [facility] Care Management [facility] Telephonic CM Phone: [phone] Fax: [phone]

## 2018-01-18 NOTE — Discharge Summary (Signed)
Physician Discharge Summary  Patient ID: Dean Cannon MRN: [id number] DOB/AGE: February 21, 1956 62 y.o.  Admit date: 01/09/2018 Discharge date: 01/10/18  Admission Diagnoses: Knee OA  Discharge Diagnoses:  Active Problems:   OA (osteoarthritis) of knee   S/P knee replacement   Discharged Condition:good  Hospital Course:  Dean Cannon is a 62 y.o. who was admitted to [facility]. They were brought to the operating room on 01/09/2018 and underwent Procedure(s): LEFT TOTAL KNEE ARTHROPLASTY.  Patient tolerated the procedure well and was later transferred to the recovery room and then to the orthopaedic floor for postoperative care.  They were given PO and IV analgesics for pain control following their surgery.  They were given 24 hours of postoperative antibiotics of  Anti-infectives (From admission, onward)   Start     Dose/Rate Route Frequency Ordered Stop   01/09/18 1700  ceFAZolin (ANCEF) IVPB 2g/100 mL premix     2 g 200 mL/hr over 30 Minutes Intravenous Every 6 hours 01/09/18 1457 01/09/18 2257   01/09/18 0745  ceFAZolin (ANCEF) IVPB 2g/100 mL premix     2 g 200 mL/hr over 30 Minutes Intravenous On call to O.R. 01/09/18 4098 01/09/18 1056     and started on DVT prophylaxis in the form of lovenox.   PT and OT were ordered for total joint protocol.  Discharge planning consulted to help with postop disposition and equipment needs.  Patient had a good night on the evening of surgery and started to get up OOB with therapy on day one.  Hemovac drain was pulled without difficulty.    The patient had progressed with therapy and meeting their goals. Patient was seen in rounds and was ready to go home.  Consults: n/a Significant Diagnostic Studies: rotuine  Treatments: routine  Discharge Exam: Blood pressure (!) 142/78, pulse (!) 56, temperature 98 F (36.7 C), temperature source Oral, resp. rate 16, height 6\' 2"  (1.88 m), weight 99.8 kg, SpO2 95 %. Alert and oriented x3. RRR, Lungs  clear, BS x4. Left Calf soft and non tender. L knee dressing C/D/I. No DVT signs. No signs of infection or compartment syndrome. LLE grossly neurovascularly intact.   Disposition:   Discharge Instructions    Call MD / Call 911   Complete by:  As directed    If you experience chest pain or shortness of breath, CALL 911 and be transported to the hospital emergency room.  If you develope a fever above 101 F, pus (white drainage) or increased drainage or redness at the wound, or calf pain, call your surgeon's office.   Call MD / Call 911   Complete by:  As directed    If you experience chest pain or shortness of breath, CALL 911 and be transported to the hospital emergency room.  If you develope a fever above 101 F, pus (white drainage) or increased drainage or redness at the wound, or calf pain, call your surgeon's office.   Change dressing   Complete by:  As directed    Maintain surgical dressing until follow up in the clinic. If the edges start to pull up, may reinforce with tape. If the dressing is no longer working, may remove and cover with gauze and tape, but must keep the area dry and clean.  Call with any questions or concerns.   Constipation Prevention   Complete by:  As directed    Drink plenty of fluids.  Prune juice may be helpful.  You may use a  stool softener, such as Colace (over the counter) 100 mg twice a day.  Use MiraLax (over the counter) for constipation as needed.   Constipation Prevention   Complete by:  As directed    Drink plenty of fluids.  Prune juice may be helpful.  You may use a stool softener, such as Colace (over the counter) 100 mg twice a day.  Use MiraLax (over the counter) for constipation as needed.   Diet - low sodium heart healthy   Complete by:  As directed    Diet - low sodium heart healthy   Complete by:  As directed    Discharge instructions   Complete by:  As directed    INSTRUCTIONS AFTER JOINT REPLACEMENT   Remove items at home which could  result in a fall. This includes throw rugs or furniture in walking pathways ICE to the affected joint every three hours while awake for 30 minutes at a time, for at least the first 3-5 days, and then as needed for pain and swelling.  Continue to use ice for pain and swelling. You may notice swelling that will progress down to the foot and ankle.  This is normal after surgery.  Elevate your leg when you are not up walking on it.   Continue to use the breathing machine you got in the hospital (incentive spirometer) which will help keep your temperature down.  It is common for your temperature to cycle up and down following surgery, especially at night when you are not up moving around and exerting yourself.  The breathing machine keeps your lungs expanded and your temperature down.   DIET:  As you were doing prior to hospitalization, we recommend a well-balanced diet.  DRESSING / WOUND CARE / SHOWERING  Keep the surgical dressing until follow up.  The dressing is water proof, so you can shower without any extra covering.  IF THE DRESSING FALLS OFF or the wound gets wet inside, change the dressing with sterile gauze.  Please use good hand washing techniques before changing the dressing.  Do not use any lotions or creams on the incision until instructed by your surgeon.    ACTIVITY  Increase activity slowly as tolerated, but follow the weight bearing instructions below.   No driving for 6 weeks or until further direction given by your physician.  You cannot drive while taking narcotics.  No lifting or carrying greater than 10 lbs. until further directed by your surgeon. Avoid periods of inactivity such as sitting longer than an hour when not asleep. This helps prevent blood clots.  You may return to work once you are authorized by your doctor.     WEIGHT BEARING   Weight bearing as tolerated with assist device (walker, cane, etc) as directed, use it as long as suggested by your surgeon or  therapist, typically at least 4-6 weeks.   EXERCISES  Results after joint replacement surgery are often greatly improved when you follow the exercise, range of motion and muscle strengthening exercises prescribed by your doctor. Safety measures are also important to protect the joint from further injury. Any time any of these exercises cause you to have increased pain or swelling, decrease what you are doing until you are comfortable again and then slowly increase them. If you have problems or questions, call your caregiver or physical therapist for advice.   Rehabilitation is important following a joint replacement. After just a few days of immobilization, the muscles of the leg can become weakened  and shrink (atrophy).  These exercises are designed to build up the tone and strength of the thigh and leg muscles and to improve motion. Often times heat used for twenty to thirty minutes before working out will loosen up your tissues and help with improving the range of motion but do not use heat for the first two weeks following surgery (sometimes heat can increase post-operative swelling).   These exercises can be done on a training (exercise) mat, on the floor, on a table or on a bed. Use whatever works the best and is most comfortable for you.    Use music or television while you are exercising so that the exercises are a pleasant break in your day. This will make your life better with the exercises acting as a break in your routine that you can look forward to.   Perform all exercises about fifteen times, three times per day or as directed.  You should exercise both the operative leg and the other leg as well.   Exercises include:   Quad Sets - Tighten up the muscle on the front of the thigh (Quad) and hold for 5-10 seconds.   Straight Leg Raises - With your knee straight (if you were given a brace, keep it on), lift the leg to 60 degrees, hold for 3 seconds, and slowly lower the leg.  Perform this  exercise against resistance later as your leg gets stronger.  Leg Slides: Lying on your back, slowly slide your foot toward your buttocks, bending your knee up off the floor (only go as far as is comfortable). Then slowly slide your foot back down until your leg is flat on the floor again.  Angel Wings: Lying on your back spread your legs to the side as far apart as you can without causing discomfort.  Hamstring Strength:  Lying on your back, push your heel against the floor with your leg straight by tightening up the muscles of your buttocks.  Repeat, but this time bend your knee to a comfortable angle, and push your heel against the floor.  You may put a pillow under the heel to make it more comfortable if necessary.   A rehabilitation program following joint replacement surgery can speed recovery and prevent re-injury in the future due to weakened muscles. Contact your doctor or a physical therapist for more information on knee rehabilitation.    CONSTIPATION  Constipation is defined medically as fewer than three stools per week and severe constipation as less than one stool per week.  Even if you have a regular bowel pattern at home, your normal regimen is likely to be disrupted due to multiple reasons following surgery.  Combination of anesthesia, postoperative narcotics, change in appetite and fluid intake all can affect your bowels.   YOU MUST use at least one of the following options; they are listed in order of increasing strength to get the job done.  They are all available over the counter, and you may need to use some, POSSIBLY even all of these options:    Drink plenty of fluids (prune juice may be helpful) and high fiber foods Colace 100 mg by mouth twice a day  Senokot for constipation as directed and as needed Dulcolax (bisacodyl), take with full glass of water  Miralax (polyethylene glycol) once or twice a day as needed.  If you have tried all these things and are unable to have a  bowel movement in the first 3-4 days after surgery call either  your surgeon or your primary doctor.    If you experience loose stools or diarrhea, hold the medications until you stool forms back up.  If your symptoms do not get better within 1 week or if they get worse, check with your doctor.  If you experience "the worst abdominal pain ever" or develop nausea or vomiting, please contact the office immediately for further recommendations for treatment.   ITCHING:  If you experience itching with your medications, try taking only a single pain pill, or even half a pain pill at a time.  You can also use Benadryl over the counter for itching or also to help with sleep.   TED HOSE STOCKINGS:  Use stockings on both legs until for at least 2 weeks or as directed by physician office. They may be removed at night for sleeping.  MEDICATIONS:  See your medication summary on the "After Visit Summary" that nursing will review with you.  You may have some home medications which will be placed on hold until you complete the course of blood thinner medication.  It is important for you to complete the blood thinner medication as prescribed.  PRECAUTIONS:  If you experience chest pain or shortness of breath - call 911 immediately for transfer to the hospital emergency department.   If you develop a fever greater that 101 F, purulent drainage from wound, increased redness or drainage from wound, foul odor from the wound/dressing, or calf pain - CONTACT YOUR SURGEON.                                                   FOLLOW-UP APPOINTMENTS:  If you do not already have a post-op appointment, please call the office for an appointment to be seen by your surgeon.  Guidelines for how soon to be seen are listed in your "After Visit Summary", but are typically between 1-4 weeks after surgery.  OTHER INSTRUCTIONS:   Knee Replacement:  Do not place pillow under knee, focus on keeping the knee straight while resting. CPM  instructions: 0-90 degrees, 2 hours in the morning, 2 hours in the afternoon, and 2 hours in the evening. Place foam block, curve side up under heel at all times except when in CPM or when walking.  DO NOT modify, tear, cut, or change the foam block in any way.  MAKE SURE YOU:  Understand these instructions.  Get help right away if you are not doing well or get worse.    Thank you for letting us be a part of your medical care team.  It is a privilege we respect greatly.  We hope these instructions will help you stay on track for a fast and full recovery!   Discharge instructions   Complete by:  As directed    Maintain surgical dressing until follow up in the clinic. If the edges start to pull up, may reinforce with tape. If the dressing is no longer working, may remove and cover with gauze and tape, but must keep the area dry and clean.  Follow up in 2 weeks at [facility] Call with any questions or concerns.   Increase activity slowly as tolerated   Complete by:  As directed    Increase activity slowly as tolerated   Complete by:  As directed    Weight bearing as tolerated with assist  device (walker, cane, etc) as directed, use it as long as suggested by your surgeon or therapist, typically at least 4-6 weeks.   TED hose   Complete by:  As directed    Use stockings (TED hose) for 2 weeks on both leg(s).  You may remove them at night for sleeping.     Allergies as of 01/10/2018   No Known Allergies     Medication List    STOP taking these medications   Glucosamine HCl 1500 MG Tabs   naproxen sodium 220 MG tablet Commonly known as:  ALEVE     TAKE these medications   amphetamine-dextroamphetamine 30 MG 24 hr capsule Commonly known as:  ADDERALL XR Take 60 mg by mouth daily.   aspirin EC 325 MG tablet Take 1 tablet (325 mg total) by mouth 2 (two) times daily.   escitalopram 10 MG tablet Commonly known as:  LEXAPRO Take 10 mg by mouth daily.   loratadine 10 MG  tablet Commonly known as:  CLARITIN Take 10 mg by mouth daily as needed for allergies.   methocarbamol 500 MG tablet Commonly known as:  ROBAXIN Take 1 tablet (500 mg total) by mouth every 8 (eight) hours as needed for muscle spasms.     ASK your doctor about these medications   oxyCODONE 5 MG immediate release tablet Commonly known as:  Oxy IR/ROXICODONE Take 1 tablet (5 mg total) by mouth every 4 (four) hours as needed for up to 7 days. Ask about: Should I take this medication?            Discharge Care Instructions  (From admission, onward)         Start     Ordered   01/10/18 0000  Change dressing    Comments:  Maintain surgical dressing until follow up in the clinic. If the edges start to pull up, may reinforce with tape. If the dressing is no longer working, may remove and cover with gauze and tape, but must keep the area dry and clean.  Call with any questions or concerns.   01/10/18 0830         Follow-up Information    Eugenia Mcalpineollins, Robert, MD. Schedule an appointment as soon as possible for a visit in 2 week(s).   Specialty:  Orthopedic Surgery Contact information: [address] [place] [address] [phone]        [facility] Follow up.   Specialty:  Home Health Services Why:  Home Health Physical Therapy-agency will call to arrange appointment Contact information: [address] [place] [address] [place] [address] [phone]           Signed: Markham JordanSTILWELL, Prince Olivier L 01/18/2018, 8:15 AM

## 2019-03-09 ENCOUNTER — Other Ambulatory Visit: Payer: Self-pay

## 2019-03-09 DIAGNOSIS — Z20822 Contact with and (suspected) exposure to covid-19: Secondary | ICD-10-CM

## 2019-03-10 LAB — NOVEL CORONAVIRUS, NAA: SARS-CoV-2, NAA: NOT DETECTED

## 2019-04-14 ENCOUNTER — Other Ambulatory Visit: Payer: Self-pay

## 2019-04-14 DIAGNOSIS — Z20822 Contact with and (suspected) exposure to covid-19: Secondary | ICD-10-CM

## 2019-04-16 LAB — NOVEL CORONAVIRUS, NAA: SARS-CoV-2, NAA: DETECTED — AB

## 2021-07-20 ENCOUNTER — Other Ambulatory Visit: Payer: Self-pay | Admitting: Gastroenterology

## 2021-07-20 DIAGNOSIS — R1033 Periumbilical pain: Secondary | ICD-10-CM

## 2021-08-15 ENCOUNTER — Ambulatory Visit
Admission: RE | Admit: 2021-08-15 | Discharge: 2021-08-15 | Disposition: A | Discharge status: Home / Self Care | Payer: Managed Care, Other (non HMO) | Source: Ambulatory Visit | Attending: Gastroenterology | Admitting: Gastroenterology

## 2021-08-15 DIAGNOSIS — R1033 Periumbilical pain: Secondary | ICD-10-CM

## 2021-08-15 MED ORDER — IOPAMIDOL (ISOVUE-300) INJECTION 61%
100.0000 mL | Freq: Once | INTRAVENOUS | Status: AC | PRN
  Administered 2021-08-15: 100 mL via INTRAVENOUS

## 2023-01-05 IMAGING — CT CT ABD-PELV W/ CM
1 of 3 series · 12 of 32 positions shown, 18 images · IV contrast (APPLIED)
Comparison: None.

CLINICAL DATA: Periumbilical pain for 6 months

EXAM:
CT ABDOMEN AND PELVIS WITH CONTRAST
TECHNIQUE: Multidetector CT imaging of the abdomen and pelvis was performed
using the standard protocol following bolus administration of
intravenous contrast.

[Series 2: abd/pelvis w/cm · axial · 0.82mm/px · z∈[+549,+934]mm · 12 of 93 slices shown, 18 images]
[im 8/93  soft-tissue]
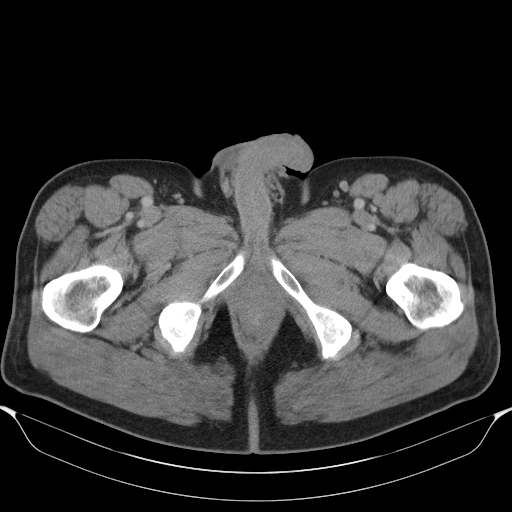
[im 8/93  bone]
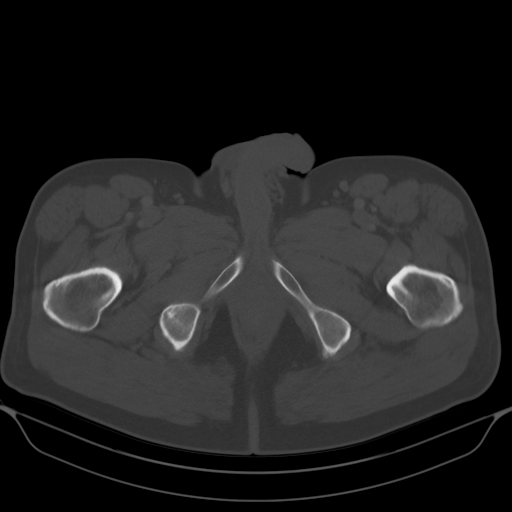
[im 15/93  soft-tissue]
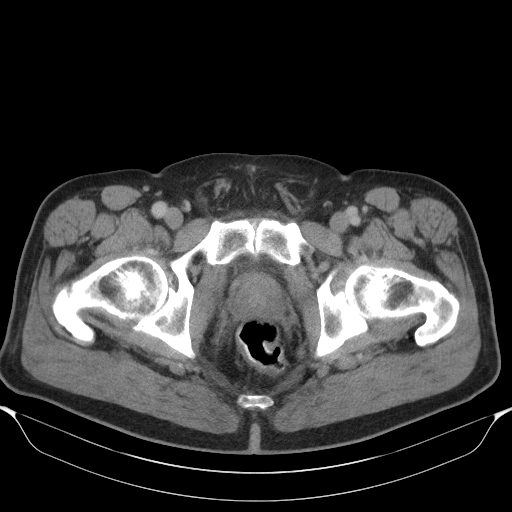
[im 22/93  soft-tissue]
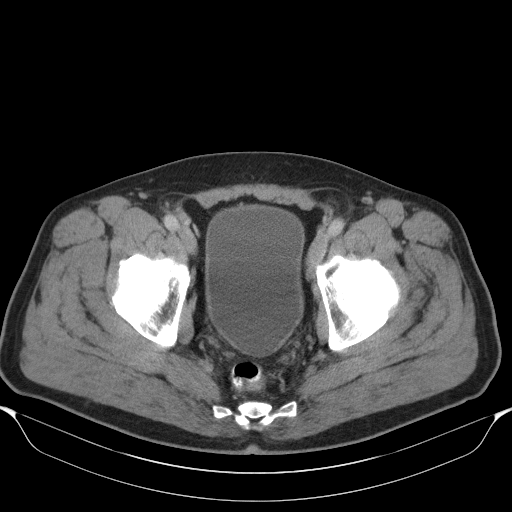
[im 29/93  soft-tissue]
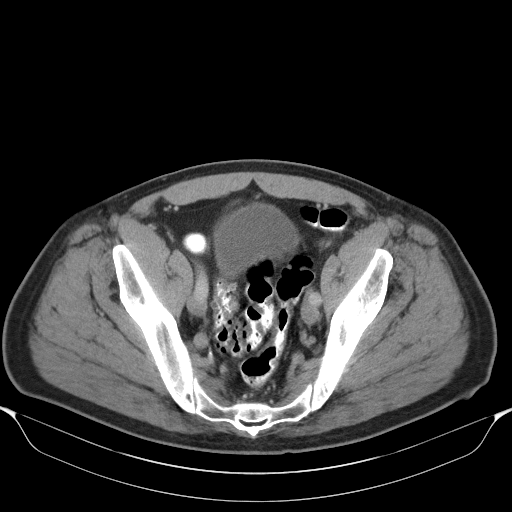
[im 36/93  soft-tissue]
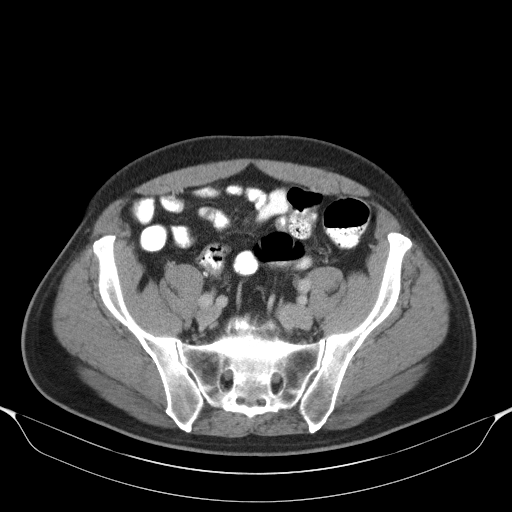
[im 43/93  soft-tissue]
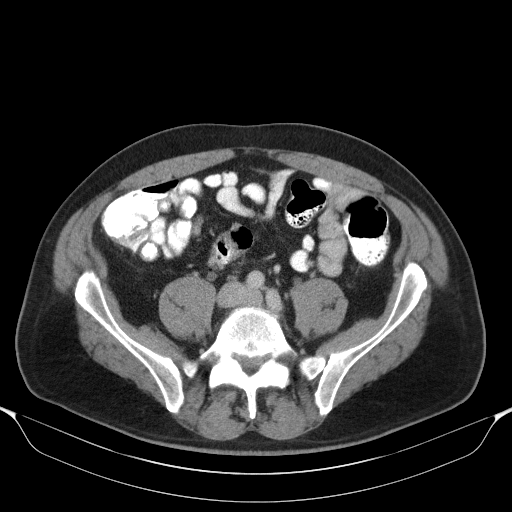
[im 50/93  soft-tissue]
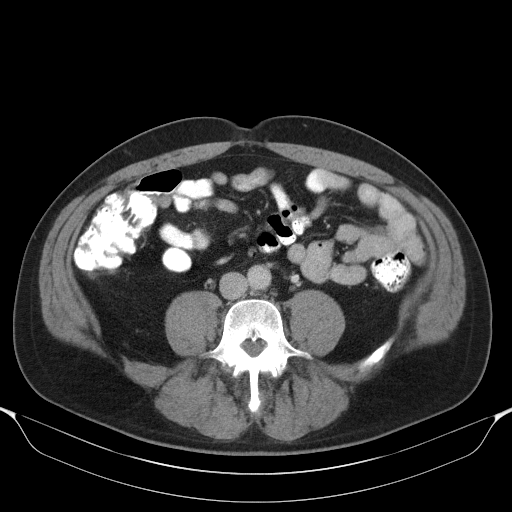
[im 57/93  soft-tissue]
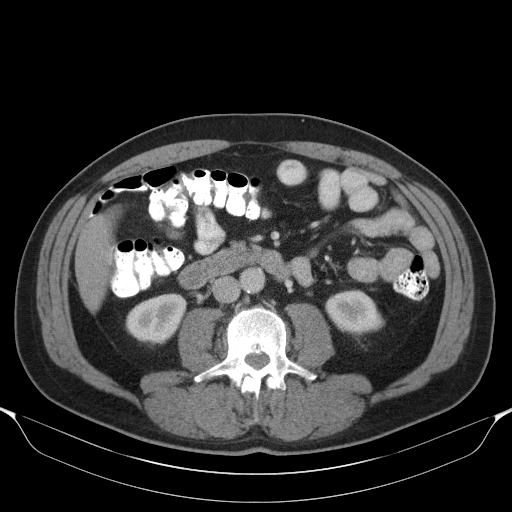
[im 64/93  soft-tissue]
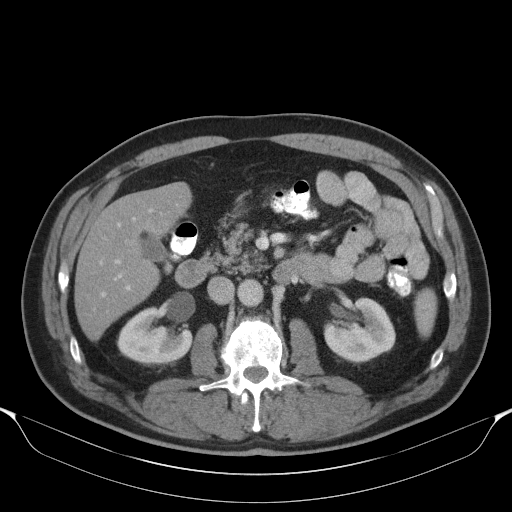
[im 64/93  lung]
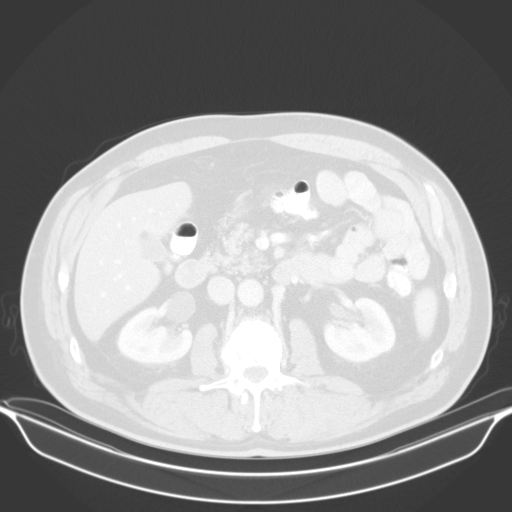
[im 64/93  bone]
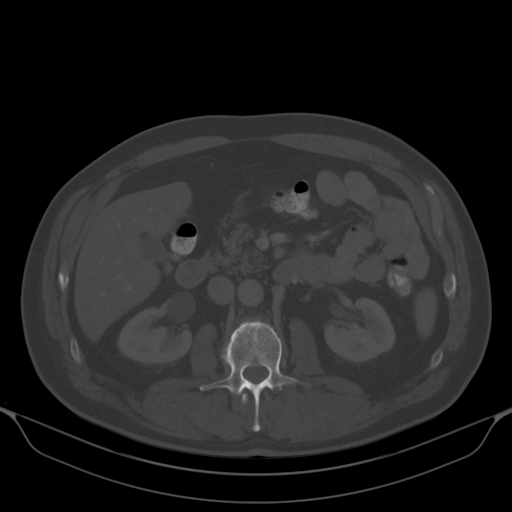
[im 71/93  soft-tissue]
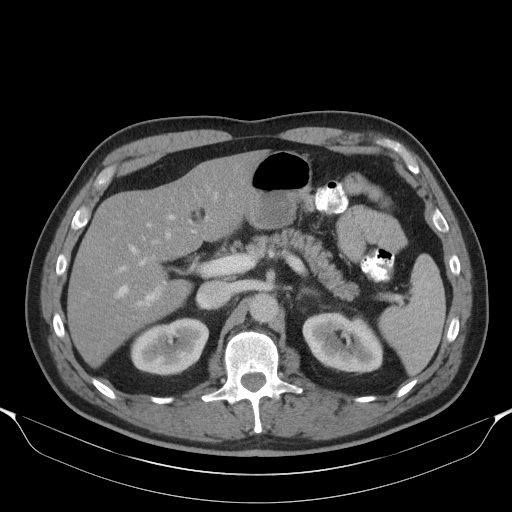
[im 71/93  lung]
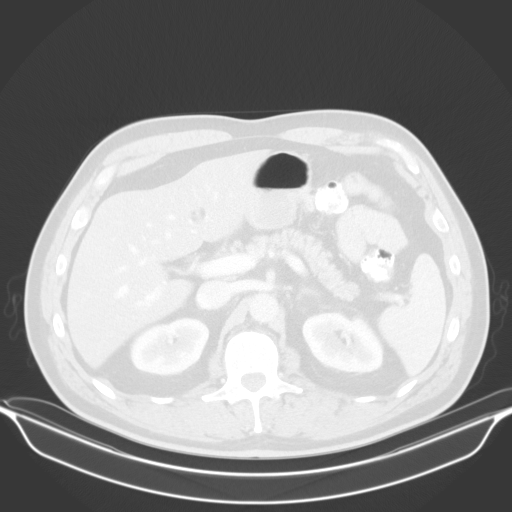
[im 78/93  soft-tissue]
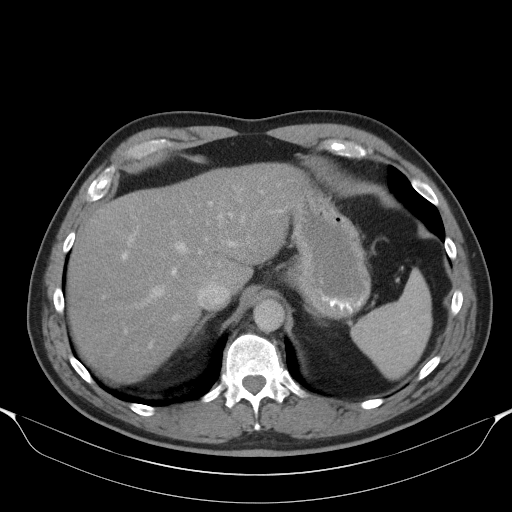
[im 78/93  lung]
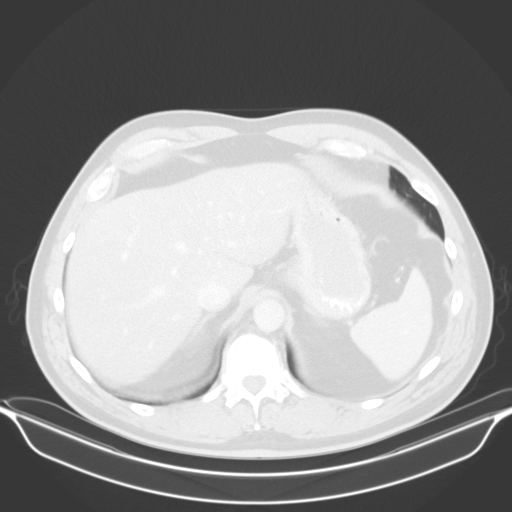
[im 85/93  soft-tissue]
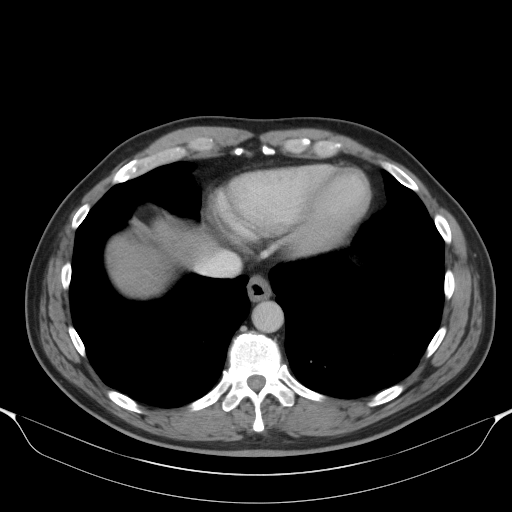
[im 85/93  lung]
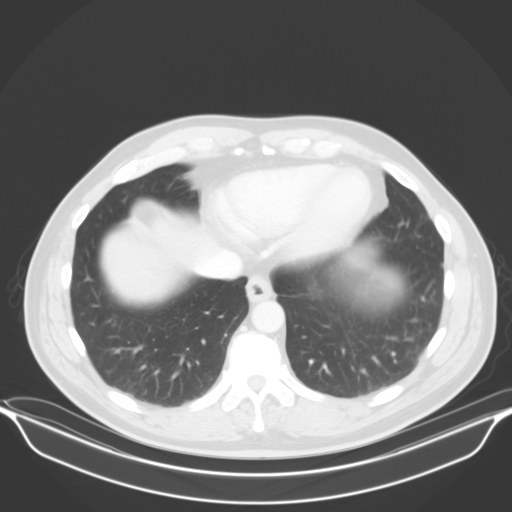

[12 of 32 positions shown; findings below may reference images not displayed]

RADIATION DOSE REDUCTION: This exam was performed according to the
departmental dose-optimization program which includes automated
exposure control, adjustment of the mA and/or kV according to
patient size and/or use of iterative reconstruction technique.

CONTRAST:  100mL NEJRWW-[id number] IOPAMIDOL (NEJRWW-[id number]) INJECTION 61%,
additional oral enteric contrast
FINDINGS: Lower chest: No acute abnormality.

Hepatobiliary: No solid liver abnormality is seen. Hepatic
steatosis. No gallstones, gallbladder wall thickening, or biliary
dilatation.

Pancreas: Unremarkable. No pancreatic ductal dilatation or
surrounding inflammatory changes.

Spleen: Normal in size without significant abnormality.

Adrenals/Urinary Tract: Adrenal glands are unremarkable. Kidneys are
normal, without renal calculi, solid lesion, or hydronephrosis.
Bladder is unremarkable.

Stomach/Bowel: Stomach is within normal limits. Appendix appears
normal. No evidence of bowel wall thickening, distention, or
inflammatory changes. Sigmoid diverticula.

Vascular/Lymphatic: Scattered aortic atherosclerosis. No enlarged
abdominal or pelvic lymph nodes.

Reproductive: No mass or other significant abnormality.

Other: No abdominal wall hernia or abnormality. No ascites.

Musculoskeletal: No acute or significant osseous findings.
IMPRESSION: 1. No acute CT findings of the abdomen or pelvis to explain
periumbilical pain.
2. Hepatic steatosis.
3. Sigmoid diverticulosis without evidence of acute diverticulitis.

Aortic Atherosclerosis ([id number]-JTF.F).
